# Patient Record
Sex: Female | Born: 1955
Health system: Southern US, Community
[De-identification: ages and names within clinical notes are randomized; demographics above are authoritative.]

## PROBLEM LIST (undated history)

## (undated) DIAGNOSIS — B019 Varicella without complication: Secondary | ICD-10-CM

## (undated) DIAGNOSIS — J302 Other seasonal allergic rhinitis: Secondary | ICD-10-CM

## (undated) DIAGNOSIS — B069 Rubella without complication: Secondary | ICD-10-CM

## (undated) HISTORY — DX: Rubella without complication: B06.9

## (undated) HISTORY — DX: Varicella without complication: B01.9

## (undated) HISTORY — PX: TONSILLECTOMY: SUR1361

## (undated) HISTORY — PX: AUGMENTATION MAMMAPLASTY: SUR837

## (undated) HISTORY — DX: Other seasonal allergic rhinitis: J30.2

## (undated) HISTORY — PX: MANDIBLE SURGERY: SHX707

---

## 1998-09-20 ENCOUNTER — Ambulatory Visit (HOSPITAL_COMMUNITY): Admission: RE | Admit: 1998-09-20 | Discharge: 1998-09-20 | Payer: Self-pay | Admitting: Family Medicine

## 1998-09-20 ENCOUNTER — Encounter: Payer: Self-pay | Admitting: Family Medicine

## 1999-03-02 ENCOUNTER — Other Ambulatory Visit: Admission: RE | Admit: 1999-03-02 | Discharge: 1999-03-02 | Payer: Self-pay | Admitting: Obstetrics and Gynecology

## 2002-04-24 ENCOUNTER — Ambulatory Visit (HOSPITAL_COMMUNITY): Admission: RE | Admit: 2002-04-24 | Discharge: 2002-04-24 | Payer: Self-pay | Admitting: Family Medicine

## 2002-04-24 ENCOUNTER — Encounter: Payer: Self-pay | Admitting: Family Medicine

## 2002-05-09 ENCOUNTER — Encounter: Payer: Self-pay | Admitting: Family Medicine

## 2002-05-09 ENCOUNTER — Ambulatory Visit (HOSPITAL_COMMUNITY): Admission: RE | Admit: 2002-05-09 | Discharge: 2002-05-09 | Payer: Self-pay | Admitting: Family Medicine

## 2003-04-15 ENCOUNTER — Other Ambulatory Visit: Admission: RE | Admit: 2003-04-15 | Discharge: 2003-04-15 | Payer: Self-pay | Admitting: Obstetrics and Gynecology

## 2004-01-26 ENCOUNTER — Other Ambulatory Visit: Admission: RE | Admit: 2004-01-26 | Discharge: 2004-01-26 | Payer: Self-pay | Admitting: Obstetrics and Gynecology

## 2004-11-17 ENCOUNTER — Other Ambulatory Visit: Admission: RE | Admit: 2004-11-17 | Discharge: 2004-11-17 | Payer: Self-pay | Admitting: Obstetrics and Gynecology

## 2005-07-28 ENCOUNTER — Other Ambulatory Visit: Admission: RE | Admit: 2005-07-28 | Discharge: 2005-07-28 | Payer: Self-pay | Admitting: Obstetrics and Gynecology

## 2007-04-16 ENCOUNTER — Emergency Department (HOSPITAL_COMMUNITY): Admission: EM | Admit: 2007-04-16 | Discharge: 2007-04-16 | Payer: Self-pay | Admitting: Emergency Medicine

## 2016-06-13 ENCOUNTER — Telehealth: Payer: Self-pay

## 2016-06-13 NOTE — Telephone Encounter (Signed)
Pre-visit call made to patient.

## 2016-06-13 NOTE — Telephone Encounter (Signed)
Called patient for pre visit information.

## 2016-06-14 ENCOUNTER — Encounter: Payer: Self-pay | Admitting: Family Medicine

## 2016-06-14 ENCOUNTER — Ambulatory Visit (INDEPENDENT_AMBULATORY_CARE_PROVIDER_SITE_OTHER): Payer: 59 | Admitting: Family Medicine

## 2016-06-14 VITALS — BP 133/83 | HR 96 | Temp 98.2°F | Ht 66.0 in | Wt 161.6 lb

## 2016-06-14 DIAGNOSIS — Z13220 Encounter for screening for lipoid disorders: Secondary | ICD-10-CM

## 2016-06-14 DIAGNOSIS — Z1329 Encounter for screening for other suspected endocrine disorder: Secondary | ICD-10-CM

## 2016-06-14 DIAGNOSIS — Z1321 Encounter for screening for nutritional disorder: Secondary | ICD-10-CM

## 2016-06-14 DIAGNOSIS — Z1231 Encounter for screening mammogram for malignant neoplasm of breast: Secondary | ICD-10-CM

## 2016-06-14 DIAGNOSIS — Z130 Encounter for screening for diseases of the blood and blood-forming organs and certain disorders involving the immune mechanism: Secondary | ICD-10-CM

## 2016-06-14 DIAGNOSIS — Z131 Encounter for screening for diabetes mellitus: Secondary | ICD-10-CM

## 2016-06-14 DIAGNOSIS — Z1239 Encounter for other screening for malignant neoplasm of breast: Secondary | ICD-10-CM

## 2016-06-14 DIAGNOSIS — E559 Vitamin D deficiency, unspecified: Secondary | ICD-10-CM

## 2016-06-14 DIAGNOSIS — Z1211 Encounter for screening for malignant neoplasm of colon: Secondary | ICD-10-CM

## 2016-06-14 DIAGNOSIS — Z119 Encounter for screening for infectious and parasitic diseases, unspecified: Secondary | ICD-10-CM

## 2016-06-14 LAB — HEMOGLOBIN A1C: HEMOGLOBIN A1C: 5.5 % (ref 4.6–6.5)

## 2016-06-14 LAB — LIPID PANEL
CHOLESTEROL: 221 mg/dL — AB (ref 0–200)
HDL: 56.9 mg/dL (ref >39.00)
LDL Cholesterol: 142 mg/dL — ABNORMAL HIGH (ref 0–99)
NONHDL: 163.6
Total CHOL/HDL Ratio: 4
Triglycerides: 106 mg/dL (ref 0.0–149.0)
VLDL: 21.2 mg/dL (ref 0.0–40.0)

## 2016-06-14 LAB — COMPREHENSIVE METABOLIC PANEL
ALT: 19 U/L (ref 0–35)
AST: 16 U/L (ref 0–37)
Albumin: 4.5 g/dL (ref 3.5–5.2)
Alkaline Phosphatase: 70 U/L (ref 39–117)
BUN: 17 mg/dL (ref 6–23)
CO2: 28 mEq/L (ref 19–32)
Calcium: 9.9 mg/dL (ref 8.4–10.5)
Chloride: 106 mEq/L (ref 96–112)
Creatinine, Ser: 0.74 mg/dL (ref 0.40–1.20)
GFR: 85.38 mL/min (ref >60.00)
Glucose, Bld: 93 mg/dL (ref 70–99)
Potassium: 4.3 mEq/L (ref 3.5–5.1)
Sodium: 142 mEq/L (ref 135–145)
Total Bilirubin: 1 mg/dL (ref 0.2–1.2)
Total Protein: 7.2 g/dL (ref 6.0–8.3)

## 2016-06-14 LAB — CBC
HCT: 45.7 % (ref 36.0–46.0)
Hemoglobin: 15.5 g/dL — ABNORMAL HIGH (ref 12.0–15.0)
MCHC: 33.9 g/dL (ref 30.0–36.0)
MCV: 86.1 fl (ref 78.0–100.0)
Platelets: 288 10*3/uL (ref 150.0–400.0)
RBC: 5.3 Mil/uL — ABNORMAL HIGH (ref 3.87–5.11)
RDW: 12.9 % (ref 11.5–15.5)
WBC: 8.1 10*3/uL (ref 4.0–10.5)

## 2016-06-14 LAB — VITAMIN D 25 HYDROXY (VIT D DEFICIENCY, FRACTURES): VITD: 16.36 ng/mL — ABNORMAL LOW (ref 30.00–100.00)

## 2016-06-14 LAB — TSH: TSH: 1.2 u[IU]/mL (ref 0.35–4.50)

## 2016-06-14 NOTE — Progress Notes (Addendum)
[place] [facility] [address] [place], [place] [address] [phone]  Date:  06/14/2016   Name:  Patricia Rush   DOB:  1956-02-21   MRN:  [id number]  PCP:  Lamar Blinks, MD    Chief Complaint: Establish Care (Pt here to est care. Would like to dicuss getting lab work done today. )   History of Present Illness:  Patricia Rush is a 60 y.o. very pleasant female patient who presents with the following:  Here today as a new patient- she is a [employer] employee She is going to see her daughter in [place] for the holidays  She is generally in good health There is a family history of DM She would like to have some labs today- screening for diabetes, thyroid, vitamin D, etc  She has a history of AR, but otherwise does not endorse any chronic health concerns  She has not yet had colon cancer screening but would like to have cologuard  She plans to have a mammogram soon- she is quite overdue Her pap is UTD; she did have a mildly abnl pap years ago but reports normal follow-up testing  She and her husband have 1 daughter, no grand so far  She is a Software engineer at [employer]  There are no active problems to display for this patient.   Past Medical History:  Diagnosis Date  . Chicken pox   . [place] measles   . Seasonal allergies     Past Surgical History:  Procedure Laterality Date  . MANDIBLE SURGERY    . TONSILLECTOMY      Social History  Substance Use Topics  . Smoking status: Not on file  . Smokeless tobacco: Not on file  . Alcohol use Not on file    No family history on file.  No Known Allergies  Medication list has been reviewed and updated.  No current outpatient prescriptions on file prior to visit.   No current facility-administered medications on file prior to visit.     Review of Systems:  As per HPI- otherwise negative.   Physical Examination: Vitals:   06/14/16 0935  BP: 133/83   Pulse: 96  Temp: 98.2 F (36.8 C)   Vitals:   06/14/16 0935  Weight: 161 lb 9.6 oz (73.3 kg)  Height: 5\' 6"  (1.676 m)   Body mass index is 26.08 kg/m. Ideal Body Weight: Weight in (lb) to have BMI = 25: 154.6  GEN: WDWN, NAD, Non-toxic, A & O x 3, looks well, minimal overweight HEENT: Atraumatic, Normocephalic. Neck supple. No masses, No LAD.  Bilateral TM wnl, oropharynx normal.  PEERL,EOMI.   Ears and Nose: No external deformity. CV: RRR, No M/G/R. No JVD. No thrill. No extra heart sounds. PULM: CTA B, no wheezes, crackles, rhonchi. No retractions. No resp. distress. No accessory muscle use. ABD: S, NT, ND. No rebound. No HSM. EXTR: No c/c/e NEURO Normal gait.  PSYCH: Normally interactive. Conversant. Not depressed or anxious appearing.  Calm demeanor.    Assessment and Plan: Encounter for vitamin deficiency screening - Plan: Vitamin D (25 hydroxy)  Screening for hyperlipidemia - Plan: Lipid panel  Screening for thyroid disorder - Plan: TSH  Screening for deficiency anemia - Plan: CBC  Screening for diabetes mellitus - Plan: Comprehensive metabolic panel, Hemoglobin A1C  Encounter for screening for infectious and parasitic diseases, unspecified - Plan: Hepatitis C antibody  Screening for breast cancer  Screening for  colon cancer  Here today as a new patient seeking screening labs  Also will arrange for cologuard for her and encouraged a mammogram She is slightly overweight and will continue to work on this Will plan further follow- up pending labs.   Signed Lamar Blinks, MD  Labs in 11/23  Results for orders placed or performed in visit on 06/14/16  CBC  Result Value Ref Range   WBC 8.1 4.0 - 10.5 K/uL   RBC 5.30 (H) 3.87 - 5.11 Mil/uL   Platelets 288.0 150.0 - 400.0 K/uL   Hemoglobin 15.5 (H) 12.0 - 15.0 g/dL   HCT 45.7 36.0 - 46.0 %   MCV 86.1 78.0 - 100.0 fl   MCHC 33.9 30.0 - 36.0 g/dL   RDW 12.9 11.5 - 15.5 %  Comprehensive metabolic panel   Result Value Ref Range   Sodium 142 135 - 145 mEq/L   Potassium 4.3 3.5 - 5.1 mEq/L   Chloride 106 96 - 112 mEq/L   CO2 28 19 - 32 mEq/L   Glucose, Bld 93 70 - 99 mg/dL   BUN 17 6 - 23 mg/dL   Creatinine, Ser 0.74 0.40 - 1.20 mg/dL   Total Bilirubin 1.0 0.2 - 1.2 mg/dL   Alkaline Phosphatase 70 39 - 117 U/L   AST 16 0 - 37 U/L   ALT 19 0 - 35 U/L   Total Protein 7.2 6.0 - 8.3 g/dL   Albumin 4.5 3.5 - 5.2 g/dL   Calcium 9.9 8.4 - 10.5 mg/dL   GFR 85.38 >60.00 mL/min  Lipid panel  Result Value Ref Range   Cholesterol 221 (H) 0 - 200 mg/dL   Triglycerides 106.0 0.0 - 149.0 mg/dL   HDL 56.90 >39.00 mg/dL   VLDL 21.2 0.0 - 40.0 mg/dL   LDL Cholesterol 142 (H) 0 - 99 mg/dL   Total CHOL/HDL Ratio 4    NonHDL 163.60   TSH  Result Value Ref Range   TSH 1.20 0.35 - 4.50 uIU/mL  Hemoglobin A1C  Result Value Ref Range   Hgb A1c MFr Bld 5.5 4.6 - 6.5 %  Hepatitis C antibody  Result Value Ref Range   HCV Ab NEGATIVE NEGATIVE  Vitamin D (25 hydroxy)  Result Value Ref Range   VITD 16.36 (L) 30.00 - 100.00 ng/mL   Message to pt- will rx vitamin D for her for 12 weeks.

## 2016-06-14 NOTE — Progress Notes (Signed)
Pre visit review using our clinic review tool, if applicable. No additional management support is needed unless otherwise documented below in the visit note. 

## 2016-06-14 NOTE — Patient Instructions (Signed)
It was a pleasure to meet you today- I hope that you have a wonderful visit in [place]! We will be in touch with your labs asap. The cologuard company will send you your kit to complete at home Please do set up a mammogram soon; this can be done at the ground floor imaging dept here at the [facility] if you like [phone]

## 2016-06-15 ENCOUNTER — Encounter: Payer: Self-pay | Admitting: Family Medicine

## 2016-06-15 LAB — HEPATITIS C ANTIBODY: HCV Ab: NEGATIVE

## 2016-06-15 MED ORDER — VITAMIN D (ERGOCALCIFEROL) 1.25 MG (50000 UNIT) PO CAPS: 50000.0000 [IU] | ORAL_CAPSULE | ORAL | 0 refills | Status: DC

## 2016-06-15 NOTE — Addendum Note (Signed)
Addended by: Lamar Blinks C on: 06/15/2016 05:22 PM   Modules accepted: Orders

## 2016-06-16 MED FILL — VIT D2 1.25 MG (50,000 UNIT: 1.25 MG | 84 days supply | Qty: 12 | Fill #0

## 2016-07-07 DIAGNOSIS — H52223 Regular astigmatism, bilateral: Secondary | ICD-10-CM | POA: Diagnosis not present

## 2016-07-07 DIAGNOSIS — H524 Presbyopia: Secondary | ICD-10-CM | POA: Diagnosis not present

## 2016-10-31 ENCOUNTER — Other Ambulatory Visit: Payer: Self-pay | Admitting: Family Medicine

## 2016-10-31 DIAGNOSIS — Z1231 Encounter for screening mammogram for malignant neoplasm of breast: Secondary | ICD-10-CM

## 2016-11-07 ENCOUNTER — Encounter (HOSPITAL_BASED_OUTPATIENT_CLINIC_OR_DEPARTMENT_OTHER): Payer: Self-pay | Admitting: Radiology

## 2016-11-07 ENCOUNTER — Ambulatory Visit (HOSPITAL_BASED_OUTPATIENT_CLINIC_OR_DEPARTMENT_OTHER)
Admission: RE | Admit: 2016-11-07 | Discharge: 2016-11-07 | Disposition: A | Discharge status: Home / Self Care | Payer: 59 | Source: Ambulatory Visit | Attending: Family Medicine | Admitting: Family Medicine

## 2016-11-07 DIAGNOSIS — Z1231 Encounter for screening mammogram for malignant neoplasm of breast: Secondary | ICD-10-CM | POA: Insufficient documentation

## 2016-12-03 ENCOUNTER — Encounter: Payer: Self-pay | Admitting: Emergency Medicine

## 2016-12-03 ENCOUNTER — Emergency Department
Admission: EM | Admit: 2016-12-03 | Discharge: 2016-12-03 | Disposition: A | Discharge status: Home / Self Care | Payer: 59 | Source: Home / Self Care | Attending: Family Medicine | Admitting: Family Medicine

## 2016-12-03 DIAGNOSIS — B029 Zoster without complications: Secondary | ICD-10-CM | POA: Diagnosis not present

## 2016-12-03 MED ORDER — VALACYCLOVIR HCL 1 G PO TABS: 1000.0000 mg | ORAL_TABLET | Freq: Three times a day (TID) | ORAL | 0 refills | Status: DC

## 2016-12-03 MED ORDER — PREDNISONE 20 MG PO TABS: ORAL_TABLET | ORAL | 0 refills | Status: DC

## 2016-12-03 NOTE — ED Triage Notes (Signed)
Patient has pronounced complications from bug bites. Recently had cellulitus of left arm; now has redness and edema around left eye and lymphadema in nodes left neck; leftover keflex did not clear this.

## 2016-12-03 NOTE — ED Provider Notes (Signed)
[name]    CSN: [id number] Arrival date & time: 12/03/16  1406     History   Chief Complaint Chief Complaint  Patient presents with  . Rash    HPI Patricia Rush is a 61 y.o. female.   Patient reports that she has not felt well during the past several days with fatigue, mild myalgias, and chills.  Two days ago she developed a rash around her left eye followed by mild rash on left neck and swollen left cervical nodes. She had recently been treated for cellulitis of her left arm with Keflex, and she tried some left-over Keflex without improvement of her present symptoms.   The history is provided by the patient.  Rash  Location:  Face and head/neck Head/neck rash location:  L neck Facial rash location:  L eyelid and forehead Quality: burning, dryness, itchiness, redness and swelling   Quality: not blistering, not draining, not painful, not scaling and not weeping   Severity:  Mild Onset quality:  Sudden Duration:  2 days Timing:  Constant Progression:  Worsening Chronicity:  New Context: not animal contact, not chemical exposure, not eggs, not exposure to similar rash, not food, not hot tub use, not insect bite/sting, not medications, not new detergent/soap, not nuts and not plant contact   Relieved by:  Nothing Worsened by:  Nothing Ineffective treatments: Keflex. Associated symptoms: fatigue and periorbital edema   Associated symptoms: no abdominal pain, no diarrhea, no fever, no headaches, no induration, no joint pain, no myalgias, no nausea, no shortness of breath, no sore throat and no throat swelling     Past Medical History:  Diagnosis Date  . Chicken pox   . [place] measles   . Seasonal allergies     There are no active problems to display for this patient.   Past Surgical History:  Procedure Laterality Date  . AUGMENTATION MAMMAPLASTY    . MANDIBLE SURGERY    . TONSILLECTOMY      OB History    No data available       Home  Medications    Prior to Admission medications   Medication Sig Start Date End Date Taking? Authorizing Provider  fluticasone (FLONASE) 50 MCG/ACT nasal spray Place into both nostrils daily.   Yes [provider]  fexofenadine (ALLEGRA) 30 MG tablet Take 30 mg by mouth 2 (two) times daily.    [provider]  ibuprofen (ADVIL,MOTRIN) 200 MG tablet Take 200 mg by mouth every 6 (six) hours as needed.    [provider]  predniSONE (DELTASONE) 20 MG tablet Take one tab by mouth twice daily for 4 days, then one daily. Take with food. 12/03/16   Kandra Nicolas, MD  valACYclovir (VALTREX) 1000 MG tablet Take 1 tablet (1,000 mg total) by mouth 3 (three) times daily. 12/03/16   Kandra Nicolas, MD  Vitamin D, Ergocalciferol, (DRISDOL) 50000 units CAPS capsule Take 1 capsule (50,000 Units total) by mouth every 7 (seven) days. 06/15/16   Copland, Gay Filler, MD    Family History History reviewed. No pertinent family history.  Social History Social History  Substance Use Topics  . Smoking status: Never Smoker  . Smokeless tobacco: Never Used  . Alcohol use Yes     Allergies   Patient has no known allergies.   Review of Systems Review of Systems  Constitutional: Positive for fatigue. Negative for fever.  HENT: Negative for sore throat.   Respiratory: Negative for shortness of breath.  Gastrointestinal: Negative for abdominal pain, diarrhea and nausea.  Musculoskeletal: Negative for arthralgias and myalgias.  Skin: Positive for rash.  Neurological: Negative for headaches.  All other systems reviewed and are negative.    Physical Exam Triage Vital Signs ED Triage Vitals  Enc Vitals Group     BP 12/03/16 1437 (!) 157/78     Pulse Rate 12/03/16 1437 94     Resp 12/03/16 1437 16     Temp 12/03/16 1437 98.2 F (36.8 C)     Temp Source 12/03/16 1437 Oral     SpO2 12/03/16 1437 99 %     Weight 12/03/16 1438 160 lb (72.6 kg)     Height 12/03/16 1438 5\' 6"   (1.676 m)     Head Circumference --      Peak Flow --      Pain Score 12/03/16 1438 0     Pain Loc --      Pain Edu? --      Excl. in [place]? --    No data found.   Updated Vital Signs BP (!) 157/78 (BP Location: Left Arm)   Pulse 94   Temp 98.2 F (36.8 C) (Oral)   Resp 16   Ht 5\' 6"  (1.676 m)   Wt 160 lb (72.6 kg)   SpO2 99%   BMI 25.82 kg/m   Visual Acuity Right Eye Distance:   Left Eye Distance:   Bilateral Distance:    Right Eye Near:   Left Eye Near:    Bilateral Near:     Physical Exam  Constitutional: She appears well-developed and well-nourished. No distress.  HENT:  Head: Atraumatic.    Right Ear: External ear normal.  Left Ear: External ear normal.  Nose: Nose normal.  Mouth/Throat: Oropharynx is clear and moist.  There is an erythematous herpetiform eruption on the left forehead, mild erythema and swelling of the left upper eyelid, and mild erythema of the left lateral neck.  Mild left lateral cervical adenopathy present. No rash on tip of nose.  Eyes: Conjunctivae are normal. Pupils are equal, round, and reactive to light.  Neck: Neck supple.  Cardiovascular: Normal heart sounds.   Pulmonary/Chest: Breath sounds normal.  Abdominal: There is no tenderness.  Lymphadenopathy:    She has cervical adenopathy.  Neurological: She is alert.  Skin: Skin is warm and dry.  Nursing note and vitals reviewed.    UC Treatments / Results  Labs (all labs ordered are listed, but only abnormal results are displayed) Labs Reviewed - No data to display  EKG  EKG Interpretation None       Radiology No results found.  Procedures Procedures (including critical care time)  Medications Ordered in UC Medications - No data to display   Initial Impression / Assessment and Plan / UC Course  I have reviewed the triage vital signs and the nursing notes.  Pertinent labs & imaging results that were available during my care of the patient were reviewed by me  and considered in my medical decision making (see chart for details).    Begin Valtrex, and prednisone burst/taper. Discontinue Keflex. Followup with dermatologist if left eye symptoms develop.    Final Clinical Impressions(s) / UC Diagnoses   Final diagnoses:  Herpes zoster without complication    New Prescriptions New Prescriptions   PREDNISONE (DELTASONE) 20 MG TABLET    Take one tab by mouth twice daily for 4 days, then one daily. Take with food.   VALACYCLOVIR (VALTREX) 1000  MG TABLET    Take 1 tablet (1,000 mg total) by mouth 3 (three) times daily.     Kandra Nicolas, MD 12/13/16 1053

## 2016-12-03 NOTE — Discharge Instructions (Signed)
Discontinue Keflex

## 2016-12-20 ENCOUNTER — Emergency Department
Admission: EM | Admit: 2016-12-20 | Discharge: 2016-12-20 | Disposition: A | Discharge status: Home / Self Care | Payer: 59 | Source: Home / Self Care | Attending: Family Medicine | Admitting: Family Medicine

## 2016-12-20 ENCOUNTER — Emergency Department (INDEPENDENT_AMBULATORY_CARE_PROVIDER_SITE_OTHER): Payer: 59

## 2016-12-20 ENCOUNTER — Encounter: Payer: Self-pay | Admitting: Emergency Medicine

## 2016-12-20 DIAGNOSIS — J209 Acute bronchitis, unspecified: Secondary | ICD-10-CM | POA: Diagnosis not present

## 2016-12-20 DIAGNOSIS — R509 Fever, unspecified: Secondary | ICD-10-CM | POA: Diagnosis not present

## 2016-12-20 DIAGNOSIS — R05 Cough: Secondary | ICD-10-CM | POA: Diagnosis not present

## 2016-12-20 MED ORDER — BENZONATATE 200 MG PO CAPS: ORAL_CAPSULE | ORAL | 0 refills | Status: DC

## 2016-12-20 NOTE — Discharge Instructions (Signed)
Begin Azithromycin 250mg : two tabs today, then one tab daily for 4 more days. Take plain guaifenesin (1200mg  extended release tabs such as Mucinex) twice daily, with plenty of water, for cough and congestion.  Get adequate rest.   Try warm salt water gargles for sore throat.  Stop all antihistamines for now, and other non-prescription cough/cold preparations.

## 2016-12-20 NOTE — ED Triage Notes (Signed)
Dry cough x 5 days fever 52f 102.2 today, body aches

## 2016-12-20 NOTE — ED Provider Notes (Signed)
[name]    CSN: [id number] Arrival date & time: 12/20/16  1700     History   Chief Complaint Chief Complaint  Patient presents with  . Cough    HPI Patricia Rush is a 61 y.o. female.   Patient developed a non-productive cough five days ago without sore throat or sinus congestion.  She has had chills, and today had fever to 102.  She has seasonal allergies, and past history of left lower lobe pneumonia 2003. She has a family history of asthma (brother).   The history is provided by the patient.    Past Medical History:  Diagnosis Date  . Chicken pox   . [place] measles   . Seasonal allergies     There are no active problems to display for this patient.   Past Surgical History:  Procedure Laterality Date  . AUGMENTATION MAMMAPLASTY    . MANDIBLE SURGERY    . TONSILLECTOMY      OB History    No data available       Home Medications    Prior to Admission medications   Medication Sig Start Date End Date Taking? Authorizing Provider  benzonatate (TESSALON) 200 MG capsule Take one cap by mouth at bedtime as needed for cough.  May repeat in 4 to 6 hours 12/20/16   Kandra Nicolas, MD  fexofenadine (ALLEGRA) 30 MG tablet Take 30 mg by mouth 2 (two) times daily.    [provider]  fluticasone (FLONASE) 50 MCG/ACT nasal spray Place into both nostrils daily.    [provider]  ibuprofen (ADVIL,MOTRIN) 200 MG tablet Take 200 mg by mouth every 6 (six) hours as needed.    [provider]  valACYclovir (VALTREX) 1000 MG tablet Take 1 tablet (1,000 mg total) by mouth 3 (three) times daily. 12/03/16   Kandra Nicolas, MD  Vitamin D, Ergocalciferol, (DRISDOL) 50000 units CAPS capsule Take 1 capsule (50,000 Units total) by mouth every 7 (seven) days. 06/15/16   Copland, Gay Filler, MD    Family History No family history on file.  Social History Social History  Substance Use Topics  . Smoking status: Never Smoker  . Smokeless  tobacco: Never Used  . Alcohol use Yes     Allergies   Patient has no known allergies.   Review of Systems Review of Systems  No sore throat + cough No pleuritic pain No wheezing No nasal congestion ? post-nasal drainage No sinus pain/pressure No itchy/red eyes No earache No hemoptysis No SOB + fever, + chills No nausea No vomiting No abdominal pain No diarrhea No urinary symptoms No skin rash + fatigue No myalgias No headache Used OTC meds without relief    Physical Exam Triage Vital Signs ED Triage Vitals  Enc Vitals Group     BP 12/20/16 1719 (!) 152/86     Pulse Rate 12/20/16 1719 (!) 102     Resp --      Temp 12/20/16 1719 99.4 F (37.4 C)     Temp Source 12/20/16 1719 Oral     SpO2 12/20/16 1719 96 %     Weight 12/20/16 1720 160 lb (72.6 kg)     Height 12/20/16 1720 5\' 6"  (1.676 m)     Head Circumference --      Peak Flow --      Pain Score 12/20/16 1720 2     Pain Loc --      Pain Edu? --  Excl. in [place]? --    No data found.   Updated Vital Signs BP (!) 152/86 (BP Location: Left Arm)   Pulse (!) 102   Temp 99.4 F (37.4 C) (Oral)   Ht 5\' 6"  (1.676 m)   Wt 160 lb (72.6 kg)   SpO2 96%   BMI 25.82 kg/m   Visual Acuity Right Eye Distance:   Left Eye Distance:   Bilateral Distance:    Right Eye Near:   Left Eye Near:    Bilateral Near:     Physical Exam Nursing notes and Vital Signs reviewed. Appearance:  Patient appears stated age, and in no acute distress Eyes:  Pupils are equal, round, and reactive to light and accomodation.  Extraocular movement is intact.  Conjunctivae are not inflamed  Ears:  Canals normal.  Tympanic membranes normal.  Nose:  Mildly congested turbinates.  No sinus tenderness.    Pharynx:  Normal Neck:  Supple.  Nontender enlarged posterior/lateral nodes are palpated bilaterally  Lungs:  Clear to auscultation.  Breath sounds are equal.  Moving air well. Heart:  Regular rate and rhythm without murmurs,  rubs, or gallops.  Abdomen:  Nontender without masses or hepatosplenomegaly.  Bowel sounds are present.  No CVA or flank tenderness.  Extremities:  No edema.  Skin:  No rash present.    UC Treatments / Results  Labs (all labs ordered are listed, but only abnormal results are displayed) Labs Reviewed - No data to display  EKG  EKG Interpretation None       Radiology Dg Chest 2 View  Result Date: 12/20/2016 CLINICAL DATA:  Nonproductive cough for 5 days.  Fever EXAM: CHEST  2 VIEW COMPARISON:  None FINDINGS: The heart size and mediastinal contours are within normal limits. Both lungs are clear. The visualized skeletal structures are unremarkable. IMPRESSION: No active cardiopulmonary disease. Electronically Signed   By: Kerby Moors M.D.   On: 12/20/2016 18:07    Procedures Procedures (including critical care time)  Medications Ordered in UC Medications - No data to display   Initial Impression / Assessment and Plan / UC Course  I have reviewed the triage vital signs and the nursing notes.  Pertinent labs & imaging results that were available during my care of the patient were reviewed by me and considered in my medical decision making (see chart for details).    Begin Z-pak for atypical coverage (already has Rx at home) Prescription written for Benzonatate [facility]) to take at bedtime for night-time cough.    Take plain guaifenesin (1200mg  extended release tabs such as Mucinex) twice daily, with plenty of water, for cough and congestion.  Get adequate rest.   Try warm salt water gargles for sore throat.  Stop all antihistamines for now, and other non-prescription cough/cold preparations. Followup with Family Doctor if not improved in one week.     Final Clinical Impressions(s) / UC Diagnoses   Final diagnoses:  Acute bronchitis, unspecified organism    New Prescriptions New Prescriptions   BENZONATATE (TESSALON) 200 MG CAPSULE    Take one cap by mouth at bedtime  as needed for cough.  May repeat in 4 to 6 hours     Kandra Nicolas, MD 12/24/16 [phone]

## 2016-12-21 MED FILL — BENZONATATE 200 MG CAP: 200 | 3 days supply | Qty: 15 | Fill #0

## 2017-09-06 DIAGNOSIS — L814 Other melanin hyperpigmentation: Secondary | ICD-10-CM | POA: Diagnosis not present

## 2017-09-06 DIAGNOSIS — L821 Other seborrheic keratosis: Secondary | ICD-10-CM | POA: Diagnosis not present

## 2017-09-06 DIAGNOSIS — D2272 Melanocytic nevi of left lower limb, including hip: Secondary | ICD-10-CM | POA: Diagnosis not present

## 2017-09-06 DIAGNOSIS — D2371 Other benign neoplasm of skin of right lower limb, including hip: Secondary | ICD-10-CM | POA: Diagnosis not present

## 2017-12-05 ENCOUNTER — Encounter: Payer: Self-pay | Admitting: Family Medicine

## 2017-12-05 DIAGNOSIS — Z1212 Encounter for screening for malignant neoplasm of rectum: Secondary | ICD-10-CM | POA: Diagnosis not present

## 2017-12-05 DIAGNOSIS — Z1211 Encounter for screening for malignant neoplasm of colon: Secondary | ICD-10-CM | POA: Diagnosis not present

## 2017-12-12 ENCOUNTER — Telehealth: Payer: Self-pay | Admitting: Family Medicine

## 2017-12-12 NOTE — Telephone Encounter (Signed)
Copied from [place] [phone]. Topic: Quick Communication - See Telephone Encounter >> Dec 12, 2017  1:17 PM Ether Griffins B wrote: CRM for notification. See Telephone encounter for: 12/12/17.  Pt turned in colo guard on 12/08/17 but the order was placed in 2017 so they are needing a new order placed so they can look at her specimen. Fax number [phone]

## 2017-12-13 NOTE — Telephone Encounter (Signed)
New order has bene faxed.

## 2017-12-21 LAB — COLOGUARD

## 2017-12-27 ENCOUNTER — Encounter: Payer: 59 | Admitting: Family Medicine

## 2017-12-28 ENCOUNTER — Telehealth: Payer: Self-pay | Admitting: *Deleted

## 2017-12-28 NOTE — Telephone Encounter (Signed)
Received Cologuard Results; forwarded to provider/SLS 06/07

## 2017-12-31 ENCOUNTER — Encounter: Payer: Self-pay | Admitting: Family Medicine

## 2018-01-12 NOTE — Progress Notes (Addendum)
[facility] [place], [place], [place] [address] [phone]  Date:  01/14/2018   Name:  Patricia Rush   DOB:  08-04-1955   MRN:  [id number]  PCP:  [name], MD    Chief Complaint: Annual Exam (a1c, hypertension, lipid panel, shingles vaccine, pap smear) and Weight Gain   History of Present Illness:  ALAISA Rush is a 62 y.o. very pleasant female patient who presents with the following:  I last saw her in November of 2017: Here today as a new patient- she is a Furniture conservator/restorer She is going to see her daughter in [place] for the holidays She is generally in good health There is a family history of DM She would like to have some labs today- screening for diabetes, thyroid, vitamin D, etc She has a history of AR, but otherwise does not endorse any chronic health concern Her pap is UTD; she did have a mildly abnl pap years ago but reports normal follow-up testing She and her husband have 1 daughter, no grand so far She is a Software engineer at [employer]  Would like to have screening labs today She is still working at [employer]  She would like to do a pap today - we will do for her  She has noted some weight gain- about 10 lbs- over the last month or so She is not sure why this might be but notes that she is not exercising like she should No LE edeam  LMP 8-9 years ago Occasional hot flashes No bleeding since menopause   She wants to do more exercise She likes to do cardio.    She is traveling to [place] this summer to do a service project with her church, will be leading some youth Wt Readings from Last 3 Encounters:  01/14/18 170 lb (77.1 kg)  12/20/16 160 lb (72.6 kg)  12/03/16 160 lb (72.6 kg)   She has been sick for about 8 days with ST, cough, LAD in her neck She did have a fever- less than 101 She is feeling better in this regard and seems to be on the mend No SOB No orthopnea  Last mammo was  4/18 Last pap: she is not sure, will update today Last colon; cologuard 5/19 TDAP is UTD through the hospital We don't have shingrix in stock today  Her mom did have osteoporosis   BP Readings from Last 3 Encounters:  01/14/18 140/88  12/20/16 (!) 152/86  12/03/16 (!) 157/78    There are no active problems to display for this patient.   Past Medical History:  Diagnosis Date  . Chicken pox   . [place] measles   . Seasonal allergies     Past Surgical History:  Procedure Laterality Date  . AUGMENTATION MAMMAPLASTY    . MANDIBLE SURGERY    . TONSILLECTOMY      Social History   Tobacco Use  . Smoking status: Never Smoker  . Smokeless tobacco: Never Used  Substance Use Topics  . Alcohol use: Yes  . Drug use: Not on file    History reviewed. No pertinent family history.  No Known Allergies  Medication list has been reviewed and updated.  Current Outpatient Medications on File Prior to Visit  Medication Sig Dispense Refill  . fexofenadine (ALLEGRA) 180 MG tablet Take 180 mg by mouth daily.    . fluticasone (FLONASE) 50 MCG/ACT nasal spray Place  into both nostrils daily.    Marland Kitchen ibuprofen (ADVIL,MOTRIN) 600 MG tablet Take 600 mg by mouth every 6 (six) hours as needed.    . valACYclovir (VALTREX) 1000 MG tablet Take 1 tablet (1,000 mg total) by mouth 3 (three) times daily. 21 tablet 0  . Vitamin D, Ergocalciferol, 2000 units CAPS Take 2,000 Units by mouth daily.     No current facility-administered medications on file prior to visit.     Review of Systems:  As per HPI- otherwise negative.   Physical Examination: Vitals:   01/14/18 1256  BP: 140/88  Pulse: 68  Resp: 16  Temp: 98.3 F (36.8 C)  SpO2: 98%   Vitals:   01/14/18 1256  Weight: 170 lb (77.1 kg)  Height: 5\' 6"  (1.676 m)   Body mass index is 27.44 kg/m. Ideal Body Weight: Weight in (lb) to have BMI = 25: 154.6  GEN: WDWN, NAD, Non-toxic, A & O x 3, looks well, overweight  HEENT: Atraumatic,  Normocephalic. Neck supple. No masses, No LAD.  Bilateral TM wnl, oropharynx normal.  PEERL,EOMI.   Ears and Nose: No external deformity. CV: RRR, No M/G/R. No JVD. No thrill. No extra heart sounds. PULM: CTA B, no wheezes, crackles, rhonchi. No retractions. No resp. distress. No accessory muscle use. ABD: S, NT, ND, +BS. No rebound. No HSM. EXTR: No c/c/e NEURO Normal gait.  PSYCH: Normally interactive. Conversant. Not depressed or anxious appearing.  Calm demeanor.  Breast: normal exam, no masses/ dimpling/ discharge- implants bilaterally  Pelvic: normal, no vaginal lesions or discharge. Uterus normal, no CMT, no adnexal tendereness or masses She does have a small red polyp at the cervical os  Assessment and Plan: Physical exam  Cervical polyp - Plan: Ambulatory referral to Obstetrics / Gynecology  Screening for cervical cancer - Plan: Cytology - PAP  Weight gain - Plan: TSH  Screening for deficiency anemia - Plan: CBC  Screening for hyperlipidemia - Plan: Lipid panel  Screening for diabetes mellitus - Plan: Comprehensive metabolic panel, Hemoglobin A1c  CPE today Labs pending as above Will plan further follow- up pending labs. Referral to OBG -? Does polyp need to be removed  Encouraged exercise and diet to return to her baseline weight of 160 lbs   Signed Lamar Blinks, MD  Received her labs  Results for orders placed or performed in visit on 01/14/18  CBC  Result Value Ref Range   WBC 8.4 4.0 - 10.5 K/uL   RBC 5.00 3.87 - 5.11 Mil/uL   Platelets 310.0 150.0 - 400.0 K/uL   Hemoglobin 14.5 12.0 - 15.0 g/dL   HCT 42.4 36.0 - 46.0 %   MCV 84.8 78.0 - 100.0 fl   MCHC 34.2 30.0 - 36.0 g/dL   RDW 12.7 11.5 - 15.5 %  Comprehensive metabolic panel  Result Value Ref Range   Sodium 140 135 - 145 mEq/L   Potassium 4.2 3.5 - 5.1 mEq/L   Chloride 105 96 - 112 mEq/L   CO2 26 19 - 32 mEq/L   Glucose, Bld 87 70 - 99 mg/dL   BUN 16 6 - 23 mg/dL   Creatinine, Ser 0.73 0.40  - 1.20 mg/dL   Total Bilirubin 1.0 0.2 - 1.2 mg/dL   Alkaline Phosphatase 72 39 - 117 U/L   AST 17 0 - 37 U/L   ALT 19 0 - 35 U/L   Total Protein 6.6 6.0 - 8.3 g/dL   Albumin 4.2 3.5 - 5.2 g/dL  Calcium 9.8 8.4 - 10.5 mg/dL   GFR 86.27 >60.00 mL/min  Hemoglobin A1c  Result Value Ref Range   Hgb A1c MFr Bld 5.6 4.6 - 6.5 %  Lipid panel  Result Value Ref Range   Cholesterol 231 (H) 0 - 200 mg/dL   Triglycerides 91.0 0.0 - 149.0 mg/dL   HDL 50.70 >39.00 mg/dL   VLDL 18.2 0.0 - 40.0 mg/dL   LDL Cholesterol 162 (H) 0 - 99 mg/dL   Total CHOL/HDL Ratio 5    NonHDL 180.68   TSH  Result Value Ref Range   TSH 2.68 0.35 - 4.50 uIU/mL   Message to pt    Blood counts are normal Metabolic profile, A5W, thyroid all normal Cholesterol not as favorable as I would like- please work on diet/ exercise/ weight loss and let's repeat in about 6 months.  If not improved we can think about a cholesterol med then Take care! JC   Received her pap 6/27- message to pt.  Will set up for OBG eval as pt needs colposcopy   Your pap was abnormal I'm afraid- did not show cancer but did show suspicious cells that need further evaluation.  I am going to set you up to see OB/GYN asap.  They will do an in-office test called colposcopy to look more closely at your cervix and will biopsy any suspicious areas for further testing. Please let me know if you have any questions and I will get your referral going right away.  Please contact me if you don't hear about your referral in the next week or so Adequacy Satisfactory for evaluation endocervical/transformation zone component PRESENT.Abnormal    Diagnosis ATYPICAL SQUAMOUS CELLS CANNOT EXCLUDE A HIGH GRADE SQUAMOUS INTRAEPITHELIAL LESION (ASC-H).Abnormal    HPV NOT DETECTED   Comment: Normal Reference Range - NOT Detected  Material Submitted CervicoVaginal Pap [ThinPrep Imaged]Abnormal

## 2018-01-14 ENCOUNTER — Encounter: Payer: Self-pay | Admitting: Family Medicine

## 2018-01-14 ENCOUNTER — Ambulatory Visit (INDEPENDENT_AMBULATORY_CARE_PROVIDER_SITE_OTHER): Payer: 59 | Admitting: Family Medicine

## 2018-01-14 ENCOUNTER — Other Ambulatory Visit (HOSPITAL_COMMUNITY)
Admission: RE | Admit: 2018-01-14 | Discharge: 2018-01-14 | Disposition: A | Discharge status: Home / Self Care | Payer: 59 | Source: Ambulatory Visit | Attending: Family Medicine | Admitting: Family Medicine

## 2018-01-14 VITALS — BP 140/88 | HR 68 | Temp 98.3°F | Resp 16 | Ht 66.0 in | Wt 170.0 lb

## 2018-01-14 DIAGNOSIS — Z124 Encounter for screening for malignant neoplasm of cervix: Secondary | ICD-10-CM | POA: Diagnosis not present

## 2018-01-14 DIAGNOSIS — Z13220 Encounter for screening for lipoid disorders: Secondary | ICD-10-CM

## 2018-01-14 DIAGNOSIS — N841 Polyp of cervix uteri: Secondary | ICD-10-CM

## 2018-01-14 DIAGNOSIS — Z130 Encounter for screening for diseases of the blood and blood-forming organs and certain disorders involving the immune mechanism: Secondary | ICD-10-CM

## 2018-01-14 DIAGNOSIS — Z0000 Encounter for general adult medical examination without abnormal findings: Secondary | ICD-10-CM | POA: Diagnosis not present

## 2018-01-14 DIAGNOSIS — R87621 Atypical squamous cells cannot exclude high grade squamous intraepithelial lesion on cytologic smear of vagina (ASC-H): Secondary | ICD-10-CM

## 2018-01-14 DIAGNOSIS — R635 Abnormal weight gain: Secondary | ICD-10-CM

## 2018-01-14 DIAGNOSIS — Z01419 Encounter for gynecological examination (general) (routine) without abnormal findings: Secondary | ICD-10-CM | POA: Diagnosis not present

## 2018-01-14 DIAGNOSIS — Z131 Encounter for screening for diabetes mellitus: Secondary | ICD-10-CM

## 2018-01-14 LAB — COMPREHENSIVE METABOLIC PANEL
ALT: 19 U/L (ref 0–35)
AST: 17 U/L (ref 0–37)
Albumin: 4.2 g/dL (ref 3.5–5.2)
Alkaline Phosphatase: 72 U/L (ref 39–117)
BUN: 16 mg/dL (ref 6–23)
CALCIUM: 9.8 mg/dL (ref 8.4–10.5)
CHLORIDE: 105 meq/L (ref 96–112)
CO2: 26 meq/L (ref 19–32)
Creatinine, Ser: 0.73 mg/dL (ref 0.40–1.20)
GFR: 86.27 mL/min (ref >60.00)
GLUCOSE: 87 mg/dL (ref 70–99)
Potassium: 4.2 mEq/L (ref 3.5–5.1)
Sodium: 140 mEq/L (ref 135–145)
Total Bilirubin: 1 mg/dL (ref 0.2–1.2)
Total Protein: 6.6 g/dL (ref 6.0–8.3)

## 2018-01-14 LAB — CBC
HCT: 42.4 % (ref 36.0–46.0)
HEMOGLOBIN: 14.5 g/dL (ref 12.0–15.0)
MCHC: 34.2 g/dL (ref 30.0–36.0)
MCV: 84.8 fl (ref 78.0–100.0)
PLATELETS: 310 10*3/uL (ref 150.0–400.0)
RBC: 5 Mil/uL (ref 3.87–5.11)
RDW: 12.7 % (ref 11.5–15.5)
WBC: 8.4 10*3/uL (ref 4.0–10.5)

## 2018-01-14 LAB — LIPID PANEL
CHOL/HDL RATIO: 5
CHOLESTEROL: 231 mg/dL — AB (ref 0–200)
HDL: 50.7 mg/dL (ref >39.00)
LDL CALC: 162 mg/dL — AB (ref 0–99)
NonHDL: 180.68
TRIGLYCERIDES: 91 mg/dL (ref 0.0–149.0)
VLDL: 18.2 mg/dL (ref 0.0–40.0)

## 2018-01-14 LAB — TSH: TSH: 2.68 u[IU]/mL (ref 0.35–4.50)

## 2018-01-14 LAB — HEMOGLOBIN A1C: Hgb A1c MFr Bld: 5.6 % (ref 4.6–6.5)

## 2018-01-14 NOTE — Patient Instructions (Addendum)
Good to see you today! I will be in touch with your labs asap Will refer to GYN here at the [facility] for your cervical polyp.  This may need to be removed  Pneumonia vaccine due at age 62  Please monitor your blood pressure at work and let me know if you are running higher than 140/85 on a regular basis    Health Maintenance for Postmenopausal Women Menopause is a normal process in which your reproductive ability comes to an end. This process happens gradually over a span of months to years, usually between the ages of 15 and 12. Menopause is complete when you have missed 12 consecutive menstrual periods. It is important to talk with your health care provider about some of the most common conditions that affect postmenopausal women, such as heart disease, cancer, and bone loss (osteoporosis). Adopting a healthy lifestyle and getting preventive care can help to promote your health and wellness. Those actions can also lower your chances of developing some of these common conditions. What should I know about menopause? During menopause, you may experience a number of symptoms, such as:  Moderate-to-severe hot flashes.  Night sweats.  Decrease in sex drive.  Mood swings.  Headaches.  Tiredness.  Irritability.  Memory problems.  Insomnia.  Choosing to treat or not to treat menopausal changes is an individual decision that you make with your health care provider. What should I know about hormone replacement therapy and supplements? Hormone therapy products are effective for treating symptoms that are associated with menopause, such as hot flashes and night sweats. Hormone replacement carries certain risks, especially as you become older. If you are thinking about using estrogen or estrogen with progestin treatments, discuss the benefits and risks with your health care provider. What should I know about heart disease and stroke? Heart disease, heart attack, and stroke become more likely  as you age. This may be due, in part, to the hormonal changes that your body experiences during menopause. These can affect how your body processes dietary fats, triglycerides, and cholesterol. Heart attack and stroke are both medical emergencies. There are many things that you can do to help prevent heart disease and stroke:  Have your blood pressure checked at least every 1-2 years. High blood pressure causes heart disease and increases the risk of stroke.  If you are 24-41 years old, ask your health care provider if you should take aspirin to prevent a heart attack or a stroke.  Do not use any tobacco products, including cigarettes, chewing tobacco, or electronic cigarettes. If you need help quitting, ask your health care provider.  It is important to eat a healthy diet and maintain a healthy weight. ? Be sure to include plenty of vegetables, fruits, low-fat dairy products, and lean protein. ? Avoid eating foods that are high in solid fats, added sugars, or salt (sodium).  Get regular exercise. This is one of the most important things that you can do for your health. ? Try to exercise for at least 150 minutes each week. The type of exercise that you do should increase your heart rate and make you sweat. This is known as moderate-intensity exercise. ? Try to do strengthening exercises at least twice each week. Do these in addition to the moderate-intensity exercise.  Know your numbers.Ask your health care provider to check your cholesterol and your blood glucose. Continue to have your blood tested as directed by your health care provider.  What should I know about cancer screening? There  are several types of cancer. Take the following steps to reduce your risk and to catch any cancer development as early as possible. Breast Cancer  Practice breast self-awareness. ? This means understanding how your breasts normally appear and feel. ? It also means doing regular breast self-exams. Let your  health care provider know about any changes, no matter how small.  If you are 66 or older, have a clinician do a breast exam (clinical breast exam or CBE) every year. Depending on your age, family history, and medical history, it may be recommended that you also have a yearly breast X-ray (mammogram).  If you have a family history of breast cancer, talk with your health care provider about genetic screening.  If you are at high risk for breast cancer, talk with your health care provider about having an MRI and a mammogram every year.  Breast cancer (BRCA) gene test is recommended for women who have family members with BRCA-related cancers. Results of the assessment will determine the need for genetic counseling and BRCA1 and for BRCA2 testing. BRCA-related cancers include these types: ? Breast. This occurs in males or females. ? Ovarian. ? Tubal. This may also be called fallopian tube cancer. ? Cancer of the abdominal or pelvic lining (peritoneal cancer). ? Prostate. ? Pancreatic.  Cervical, Uterine, and Ovarian Cancer Your health care provider may recommend that you be screened regularly for cancer of the pelvic organs. These include your ovaries, uterus, and vagina. This screening involves a pelvic exam, which includes checking for microscopic changes to the surface of your cervix (Pap test).  For women ages 21-65, health care providers may recommend a pelvic exam and a Pap test every three years. For women ages 25-65, they may recommend the Pap test and pelvic exam, combined with testing for human papilloma virus (HPV), every five years. Some types of HPV increase your risk of cervical cancer. Testing for HPV may also be done on women of any age who have unclear Pap test results.  Other health care providers may not recommend any screening for nonpregnant women who are considered low risk for pelvic cancer and have no symptoms. Ask your health care provider if a screening pelvic exam is right  for you.  If you have had past treatment for cervical cancer or a condition that could lead to cancer, you need Pap tests and screening for cancer for at least 20 years after your treatment. If Pap tests have been discontinued for you, your risk factors (such as having a new sexual partner) need to be reassessed to determine if you should start having screenings again. Some women have medical problems that increase the chance of getting cervical cancer. In these cases, your health care provider may recommend that you have screening and Pap tests more often.  If you have a family history of uterine cancer or ovarian cancer, talk with your health care provider about genetic screening.  If you have vaginal bleeding after reaching menopause, tell your health care provider.  There are currently no reliable tests available to screen for ovarian cancer.  Lung Cancer Lung cancer screening is recommended for adults 68-29 years old who are at high risk for lung cancer because of a history of smoking. A yearly low-dose CT scan of the lungs is recommended if you:  Currently smoke.  Have a history of at least 30 pack-years of smoking and you currently smoke or have quit within the past 15 years. A pack-year is smoking an average  of one pack of cigarettes per day for one year.  Yearly screening should:  Continue until it has been 15 years since you quit.  Stop if you develop a health problem that would prevent you from having lung cancer treatment.  Colorectal Cancer  This type of cancer can be detected and can often be prevented.  Routine colorectal cancer screening usually begins at age 53 and continues through age 19.  If you have risk factors for colon cancer, your health care provider may recommend that you be screened at an earlier age.  If you have a family history of colorectal cancer, talk with your health care provider about genetic screening.  Your health care provider may also  recommend using home test kits to check for hidden blood in your stool.  A small camera at the end of a tube can be used to examine your colon directly (sigmoidoscopy or colonoscopy). This is done to check for the earliest forms of colorectal cancer.  Direct examination of the colon should be repeated every 5-10 years until age 69. However, if early forms of precancerous polyps or small growths are found or if you have a family history or genetic risk for colorectal cancer, you may need to be screened more often.  Skin Cancer  Check your skin from head to toe regularly.  Monitor any moles. Be sure to tell your health care provider: ? About any new moles or changes in moles, especially if there is a change in a mole's shape or color. ? If you have a mole that is larger than the size of a pencil eraser.  If any of your family members has a history of skin cancer, especially at a young age, talk with your health care provider about genetic screening.  Always use sunscreen. Apply sunscreen liberally and repeatedly throughout the day.  Whenever you are outside, protect yourself by wearing long sleeves, pants, a wide-brimmed hat, and sunglasses.  What should I know about osteoporosis? Osteoporosis is a condition in which bone destruction happens more quickly than new bone creation. After menopause, you may be at an increased risk for osteoporosis. To help prevent osteoporosis or the bone fractures that can happen because of osteoporosis, the following is recommended:  If you are 32-10 years old, get at least 1,000 mg of calcium and at least 600 mg of vitamin D per day.  If you are older than age 32 but younger than age 26, get at least 1,200 mg of calcium and at least 600 mg of vitamin D per day.  If you are older than age 18, get at least 1,200 mg of calcium and at least 800 mg of vitamin D per day.  Smoking and excessive alcohol intake increase the risk of osteoporosis. Eat foods that are  rich in calcium and vitamin D, and do weight-bearing exercises several times each week as directed by your health care provider. What should I know about how menopause affects my mental health? Depression may occur at any age, but it is more common as you become older. Common symptoms of depression include:  Low or sad mood.  Changes in sleep patterns.  Changes in appetite or eating patterns.  Feeling an overall lack of motivation or enjoyment of activities that you previously enjoyed.  Frequent crying spells.  Talk with your health care provider if you think that you are experiencing depression. What should I know about immunizations? It is important that you get and maintain your immunizations. These  include:  Tetanus, diphtheria, and pertussis (Tdap) booster vaccine.  Influenza every year before the flu season begins.  Pneumonia vaccine.  Shingles vaccine.  Your health care provider may also recommend other immunizations. This information is not intended to replace advice given to you by your health care provider. Make sure you discuss any questions you have with your health care provider. Document Released: 09/01/2005 Document Revised: 01/28/2016 Document Reviewed: 04/13/2015 Elsevier Interactive Patient Education  2018 [employer].

## 2018-01-16 LAB — CYTOLOGY - PAP: HPV: NOT DETECTED

## 2018-01-17 ENCOUNTER — Encounter: Payer: Self-pay | Admitting: Family Medicine

## 2018-01-17 NOTE — Addendum Note (Signed)
Addended by: Lamar Blinks C on: 01/17/2018 06:27 AM   Modules accepted: Orders

## 2018-02-20 ENCOUNTER — Ambulatory Visit (INDEPENDENT_AMBULATORY_CARE_PROVIDER_SITE_OTHER): Payer: 59 | Admitting: Obstetrics & Gynecology

## 2018-02-20 ENCOUNTER — Encounter: Payer: Self-pay | Admitting: Obstetrics & Gynecology

## 2018-02-20 ENCOUNTER — Other Ambulatory Visit (HOSPITAL_COMMUNITY)
Admission: RE | Admit: 2018-02-20 | Discharge: 2018-02-20 | Disposition: A | Discharge status: Home / Self Care | Payer: 59 | Source: Ambulatory Visit | Attending: Obstetrics & Gynecology | Admitting: Obstetrics & Gynecology

## 2018-02-20 VITALS — BP 145/74 | HR 75 | Ht 66.0 in | Wt 168.0 lb

## 2018-02-20 DIAGNOSIS — N841 Polyp of cervix uteri: Secondary | ICD-10-CM

## 2018-02-20 DIAGNOSIS — R87611 Atypical squamous cells cannot exclude high grade squamous intraepithelial lesion on cytologic smear of cervix (ASC-H): Secondary | ICD-10-CM | POA: Diagnosis not present

## 2018-02-20 NOTE — Progress Notes (Signed)
surg path Patient given informed consent, signed copy in the chart, time out was performed.  Placed in lithotomy position. Cervix viewed with speculum and colposcope after application of acetic acid.  01/14/2018 Satisfactory for evaluation endocervical/transformation zone component PRESENT.Abnormal     Diagnosis ATYPICAL SQUAMOUS CELLS CANNOT EXCLUDE A HIGH GRADE SQUAMOUS INTRAEPITHELIAL LESION (ASC-H).Abnormal    HPV NOT DETECTED   Comment: Normal Reference Range - NOT Detected  Material Submitted CervicoVaginal Pap [ThinPrep Imaged]Abnormal      Colposcopy adequate?  yes Acetowhite lesions? no Punctation? no Mosaicism?  No Abnormal vasculature?  No Biopsies? no ECC? Yes Endocervical polyp removed     Patient was given post procedure instructions.  F/u surg path. If WNL needs repeat PAP in 1 year.   Layana Konkel L. Harraway-Smith, M.D., Cherlynn June

## 2018-02-20 NOTE — Patient Instructions (Signed)

## 2019-03-09 NOTE — Patient Instructions (Addendum)
It was very nice to see you again today, I will be in touch with your labs and Pap as soon as possible 2nd shingles vaccine due in 2-6 months   Please continue to monitor your BP at home- if your average is higher than 135- 140/90 we should consider medication for you    We will do a Ca-125 lab for you today to look for any evidence of ovarian cancer   Health Maintenance, Female Adopting a healthy lifestyle and getting preventive care are important in promoting health and wellness. Ask your health care provider about:  The right schedule for you to have regular tests and exams.  Things you can do on your own to prevent diseases and keep yourself healthy. What should I know about diet, weight, and exercise? Eat a healthy diet   Eat a diet that includes plenty of vegetables, fruits, low-fat dairy products, and lean protein.  Do not eat a lot of foods that are high in solid fats, added sugars, or sodium. Maintain a healthy weight Body mass index (BMI) is used to identify weight problems. It estimates body fat based on height and weight. Your health care provider can help determine your BMI and help you achieve or maintain a healthy weight. Get regular exercise Get regular exercise. This is one of the most important things you can do for your health. Most adults should:  Exercise for at least 150 minutes each week. The exercise should increase your heart rate and make you sweat (moderate-intensity exercise).  Do strengthening exercises at least twice a week. This is in addition to the moderate-intensity exercise.  Spend less time sitting. Even light physical activity can be beneficial. Watch cholesterol and blood lipids Have your blood tested for lipids and cholesterol at 62 years of age, then have this test every 5 years. Have your cholesterol levels checked more often if:  Your lipid or cholesterol levels are high.  You are older than 62 years of age.  You are at high risk for  heart disease. What should I know about cancer screening? Depending on your health history and family history, you may need to have cancer screening at various ages. This may include screening for:  Breast cancer.  Cervical cancer.  Colorectal cancer.  Skin cancer.  Lung cancer. What should I know about heart disease, diabetes, and high blood pressure? Blood pressure and heart disease  High blood pressure causes heart disease and increases the risk of stroke. This is more likely to develop in people who have high blood pressure readings, are of African descent, or are overweight.  Have your blood pressure checked: ? Every 3-5 years if you are 45-44 years of age. ? Every year if you are 31 years old or older. Diabetes Have regular diabetes screenings. This checks your fasting blood sugar level. Have the screening done:  Once every three years after age 15 if you are at a normal weight and have a low risk for diabetes.  More often and at a younger age if you are overweight or have a high risk for diabetes. What should I know about preventing infection? Hepatitis B If you have a higher risk for hepatitis B, you should be screened for this virus. Talk with your health care provider to find out if you are at risk for hepatitis B infection. Hepatitis C Testing is recommended for:  Everyone born from [birth year] through 1964.  Anyone with known risk factors for hepatitis C. Sexually transmitted infections (STIs)  Get screened for STIs, including gonorrhea and chlamydia, if: ? You are sexually active and are younger than 62 years of age. ? You are older than 62 years of age and your health care provider tells you that you are at risk for this type of infection. ? Your sexual activity has changed since you were last screened, and you are at increased risk for chlamydia or gonorrhea. Ask your health care provider if you are at risk.  Ask your health care provider about whether you are at  high risk for HIV. Your health care provider may recommend a prescription medicine to help prevent HIV infection. If you choose to take medicine to prevent HIV, you should first get tested for HIV. You should then be tested every 3 months for as long as you are taking the medicine. Pregnancy  If you are about to stop having your period (premenopausal) and you may become pregnant, seek counseling before you get pregnant.  Take 400 to 800 micrograms (mcg) of folic acid every day if you become pregnant.  Ask for birth control (contraception) if you want to prevent pregnancy. Osteoporosis and menopause Osteoporosis is a disease in which the bones lose minerals and strength with aging. This can result in bone fractures. If you are 64 years old or older, or if you are at risk for osteoporosis and fractures, ask your health care provider if you should:  Be screened for bone loss.  Take a calcium or vitamin D supplement to lower your risk of fractures.  Be given hormone replacement therapy (HRT) to treat symptoms of menopause. Follow these instructions at home: Lifestyle  Do not use any products that contain nicotine or tobacco, such as cigarettes, e-cigarettes, and chewing tobacco. If you need help quitting, ask your health care provider.  Do not use street drugs.  Do not share needles.  Ask your health care provider for help if you need support or information about quitting drugs. Alcohol use  Do not drink alcohol if: ? Your health care provider tells you not to drink. ? You are pregnant, may be pregnant, or are planning to become pregnant.  If you drink alcohol: ? Limit how much you use to 0-1 drink a day. ? Limit intake if you are breastfeeding.  Be aware of how much alcohol is in your drink. In the [place] one drink equals one 12 oz bottle of beer (355 mL), one 5 oz glass of wine (148 mL), or one 1 oz glass of hard liquor (44 mL). General instructions  Schedule regular health,  dental, and eye exams.  Stay current with your vaccines.  Tell your health care provider if: ? You often feel depressed. ? You have ever been abused or do not feel safe at home. Summary  Adopting a healthy lifestyle and getting preventive care are important in promoting health and wellness.  Follow your health care provider's instructions about healthy diet, exercising, and getting tested or screened for diseases.  Follow your health care provider's instructions on monitoring your cholesterol and blood pressure. This information is not intended to replace advice given to you by your health care provider. Make sure you discuss any questions you have with your health care provider. Document Released: 01/23/2011 Document Revised: 07/03/2018 Document Reviewed: 07/03/2018 Elsevier Patient Education  2020 [employer].

## 2019-03-09 NOTE — Progress Notes (Addendum)
[facility] [address], [place], [place] [address] [phone]  Date:  03/17/2019   Name:  Patricia Rush   DOB:  05-25-1956   MRN:  [id number]  PCP:  [name], MD    Chief Complaint: Annual Exam   History of Present Illness:  Patricia Rush is a 63 y.o. 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 Ihor Dow, and she underwent colposcopy in July 2019.  Reviewed surgical pathology which is benign Per Dr. 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  There is a questionable family history of ovarian cancer in an elderly relative.  She would like to have a Ca-125 drawn for reassurance, understands there is no standard screening for ovarian cancer   BP Readings from Last 3 Encounters:  03/17/19 (!) 150/78  02/20/18 (!) 145/74  01/14/18 140/88   Wt Readings from Last 3  Encounters:  03/17/19 170 lb (77.1 kg)  02/20/18 168 lb (76.2 kg)  01/14/18 170 lb (77.1 kg)     Patient Active Problem List   Diagnosis Date Noted  . Borderline blood pressure 03/17/2019    Past Medical History:  Diagnosis Date  . Chicken pox   . [place] measles   . Seasonal allergies     Past Surgical History:  Procedure Laterality Date  . AUGMENTATION MAMMAPLASTY    . MANDIBLE SURGERY    . TONSILLECTOMY      Social History   Tobacco Use  . Smoking status: Never Smoker  . Smokeless tobacco: Never Used  Substance Use Topics  . Alcohol use: Never    Frequency: Never  . Drug use: Never    Family History  Problem Relation Age of Onset  . Alzheimer's disease Father     No Known Allergies  Medication list has been reviewed and updated.  Current Outpatient Medications on File Prior to Visit  Medication Sig Dispense Refill  . fexofenadine (ALLEGRA) 180 MG tablet Take 180 mg by mouth daily.    Marland Kitchen ibuprofen (ADVIL,MOTRIN) 600 MG tablet Take 600 mg by mouth every 6 (six) hours as needed.    . mometasone (NASONEX) 50 MCG/ACT nasal spray Place 2 sprays into the nose daily.    . Vitamin D, Ergocalciferol, 2000 units CAPS Take 2,000 Units by  mouth daily.     No current facility-administered medications on file prior to visit.     Review of Systems:  As per HPI- otherwise negative. No CP or SOB Not exercising as much since gyms are closed- she has not gained weight but wants to lose    Physical Examination: Vitals:   03/17/19 1015 03/17/19 1028  BP: (!) 174/90 (!) 150/78  Pulse: 86   Resp: 16   Temp: (!) 97.1 F (36.2 C)   SpO2: 98%    Vitals:   03/17/19 1015  Weight: 170 lb (77.1 kg)  Height: 5\' 6"  (1.676 m)   Body mass index is 27.44 kg/m. Ideal Body Weight: Weight in (lb) to have BMI = 25: 154.6  GEN: WDWN, NAD, Non-toxic, A & O x 3 HEENT: Atraumatic, Normocephalic. Neck supple. No masses, No LAD. Ears and Nose: No external deformity. CV: RRR,  No M/G/R. No JVD. No thrill. No extra heart sounds. PULM: CTA B, no wheezes, crackles, rhonchi. No retractions. No resp. distress. No accessory muscle use. ABD: S, NT, ND, +BS. No rebound. No HSM. EXTR: No c/c/e NEURO Normal gait.  PSYCH: Normally interactive. Conversant. Not depressed or anxious appearing.  Calm demeanor.  Breast: normal exam s/p implants, no masses/ dimpling/ discharge Pelvic: normal, no vaginal lesions or discharge. Uterus normal, no CMT, no adnexal tendereness or masses  Assessment and Plan: Physical exam  Screening for cervical cancer - Plan: Cytology - PAP  Screening for hyperlipidemia - Plan: Lipid panel  Screening for deficiency anemia - Plan: CBC  Screening for diabetes mellitus - Plan: Comprehensive metabolic panel, Hemoglobin A1c  Screening for breast cancer - Plan: MM 3D SCREEN BREAST W/IMPLANT BILATERAL, CANCELED: MM 3D SCREEN BREAST BILATERAL  Screening for HIV (human immunodeficiency virus) - Plan: HIV Antibody (routine testing w rflx)  Immunization due - Plan: Varicella-zoster vaccine IM (Shingrix)  Vitamin D deficiency - Plan: Vitamin D (25 hydroxy)  Abdominal bloating - Plan: CA 125  Borderline blood pressure   Here today for a CPE Labs as above Ordered mammo shingrix #1 Will get flu shot at work  Her BP is borderline- she will continue to monitor and let me know if elevated She may get some abd bloating at times, will run Ca-125  Signed Lamar Blinks, MD  Received labs so far, message to pt  Results for orders placed or performed in visit on 03/17/19  CBC  Result Value Ref Range   WBC 8.0 4.0 - 10.5 K/uL   RBC 4.98 3.87 - 5.11 Mil/uL   Platelets 306.0 150.0 - 400.0 K/uL   Hemoglobin 14.2 12.0 - 15.0 g/dL   HCT 42.8 36.0 - 46.0 %   MCV 85.8 78.0 - 100.0 fl   MCHC 33.2 30.0 - 36.0 g/dL   RDW 12.9 11.5 - 15.5 %  Comprehensive metabolic panel  Result Value Ref Range   Sodium 141 135 - 145 mEq/L   Potassium 3.9 3.5 - 5.1  mEq/L   Chloride 106 96 - 112 mEq/L   CO2 25 19 - 32 mEq/L   Glucose, Bld 95 70 - 99 mg/dL   BUN 15 6 - 23 mg/dL   Creatinine, Ser 0.65 0.40 - 1.20 mg/dL   Total Bilirubin 1.1 0.2 - 1.2 mg/dL   Alkaline Phosphatase 71 39 - 117 U/L   AST 14 0 - 37 U/L   ALT 16 0 - 35 U/L   Total Protein 6.5 6.0 - 8.3 g/dL   Albumin 4.5 3.5 -  5.2 g/dL   Calcium 9.6 8.4 - 10.5 mg/dL   GFR 92.44 >60.00 mL/min  Hemoglobin A1c  Result Value Ref Range   Hgb A1c MFr Bld 5.5 4.6 - 6.5 %  Lipid panel  Result Value Ref Range   Cholesterol 218 (H) 0 - 200 mg/dL   Triglycerides 74.0 0.0 - 149.0 mg/dL   HDL 56.70 >39.00 mg/dL   VLDL 14.8 0.0 - 40.0 mg/dL   LDL Cholesterol 147 (H) 0 - 99 mg/dL   Total CHOL/HDL Ratio 4    NonHDL 161.74   HIV Antibody (routine testing w rflx)  Result Value Ref Range   HIV 1&2 Ab, 4th Generation NON-REACTIVE NON-REACTI  Vitamin D (25 hydroxy)  Result Value Ref Range   VITD 17.55 (L) 30.00 - 100.00 ng/mL    Received further labs 8/25, message to patient Your HIV is negative as expected Vitamin D is low again.  I will send in a course of high-dose vitamin D supplement, 50,000 units once a week-please take this for 12 weeks, and then continue with approximately 2000 international units over-the-counter daily  Your lipids are reasonable, see estimated 10-year risk of cardiovascular disease calculated below  The 10-year ASCVD risk score Mikey Bussing DC Brooke Bonito., et al., 2013) is: 5.2%   Values used to calculate the score:     Age: 14 years     Sex: Female     Is Non-Hispanic African American: No     Diabetic: No     Tobacco smoker: No     Systolic Blood Pressure: 686 mmHg     Is BP treated: No     HDL Cholesterol: 56.7 mg/dL     Total Cholesterol: 168 mg/dL  Certainly we could start cholesterol medication if you wish, but I do not think compulsory at this point  Take care, let me know if any questions

## 2019-03-17 ENCOUNTER — Other Ambulatory Visit (HOSPITAL_COMMUNITY)
Admission: RE | Admit: 2019-03-17 | Discharge: 2019-03-17 | Disposition: A | Discharge status: Home / Self Care | Payer: 59 | Source: Ambulatory Visit | Attending: Family Medicine | Admitting: Family Medicine

## 2019-03-17 ENCOUNTER — Encounter: Payer: Self-pay | Admitting: Family Medicine

## 2019-03-17 ENCOUNTER — Ambulatory Visit (INDEPENDENT_AMBULATORY_CARE_PROVIDER_SITE_OTHER): Payer: 59 | Admitting: Family Medicine

## 2019-03-17 ENCOUNTER — Other Ambulatory Visit: Payer: Self-pay

## 2019-03-17 VITALS — BP 150/78 | HR 86 | Temp 97.1°F | Resp 16 | Ht 66.0 in | Wt 170.0 lb

## 2019-03-17 DIAGNOSIS — Z0000 Encounter for general adult medical examination without abnormal findings: Secondary | ICD-10-CM

## 2019-03-17 DIAGNOSIS — Z131 Encounter for screening for diabetes mellitus: Secondary | ICD-10-CM | POA: Diagnosis not present

## 2019-03-17 DIAGNOSIS — Z130 Encounter for screening for diseases of the blood and blood-forming organs and certain disorders involving the immune mechanism: Secondary | ICD-10-CM | POA: Diagnosis not present

## 2019-03-17 DIAGNOSIS — Z1239 Encounter for other screening for malignant neoplasm of breast: Secondary | ICD-10-CM

## 2019-03-17 DIAGNOSIS — Z23 Encounter for immunization: Secondary | ICD-10-CM

## 2019-03-17 DIAGNOSIS — Z114 Encounter for screening for human immunodeficiency virus [HIV]: Secondary | ICD-10-CM

## 2019-03-17 DIAGNOSIS — E559 Vitamin D deficiency, unspecified: Secondary | ICD-10-CM | POA: Diagnosis not present

## 2019-03-17 DIAGNOSIS — Z124 Encounter for screening for malignant neoplasm of cervix: Secondary | ICD-10-CM

## 2019-03-17 DIAGNOSIS — Z13220 Encounter for screening for lipoid disorders: Secondary | ICD-10-CM

## 2019-03-17 DIAGNOSIS — R030 Elevated blood-pressure reading, without diagnosis of hypertension: Secondary | ICD-10-CM

## 2019-03-17 DIAGNOSIS — R140 Abdominal distension (gaseous): Secondary | ICD-10-CM | POA: Diagnosis not present

## 2019-03-17 HISTORY — DX: Elevated blood-pressure reading, without diagnosis of hypertension: R03.0

## 2019-03-17 LAB — COMPREHENSIVE METABOLIC PANEL
ALT: 16 U/L (ref 0–35)
AST: 14 U/L (ref 0–37)
Albumin: 4.5 g/dL (ref 3.5–5.2)
Alkaline Phosphatase: 71 U/L (ref 39–117)
BUN: 15 mg/dL (ref 6–23)
CO2: 25 mEq/L (ref 19–32)
Calcium: 9.6 mg/dL (ref 8.4–10.5)
Chloride: 106 mEq/L (ref 96–112)
Creatinine, Ser: 0.65 mg/dL (ref 0.40–1.20)
GFR: 92.44 mL/min (ref >60.00)
Glucose, Bld: 95 mg/dL (ref 70–99)
Potassium: 3.9 mEq/L (ref 3.5–5.1)
Sodium: 141 mEq/L (ref 135–145)
Total Bilirubin: 1.1 mg/dL (ref 0.2–1.2)
Total Protein: 6.5 g/dL (ref 6.0–8.3)

## 2019-03-17 LAB — CBC
HCT: 42.8 % (ref 36.0–46.0)
Hemoglobin: 14.2 g/dL (ref 12.0–15.0)
MCHC: 33.2 g/dL (ref 30.0–36.0)
MCV: 85.8 fl (ref 78.0–100.0)
Platelets: 306 10*3/uL (ref 150.0–400.0)
RBC: 4.98 Mil/uL (ref 3.87–5.11)
RDW: 12.9 % (ref 11.5–15.5)
WBC: 8 10*3/uL (ref 4.0–10.5)

## 2019-03-17 LAB — LIPID PANEL
Cholesterol: 218 mg/dL — ABNORMAL HIGH (ref 0–200)
HDL: 56.7 mg/dL (ref >39.00)
LDL Cholesterol: 147 mg/dL — ABNORMAL HIGH (ref 0–99)
NonHDL: 161.74
Total CHOL/HDL Ratio: 4
Triglycerides: 74 mg/dL (ref 0.0–149.0)
VLDL: 14.8 mg/dL (ref 0.0–40.0)

## 2019-03-17 LAB — HEMOGLOBIN A1C: Hgb A1c MFr Bld: 5.5 % (ref 4.6–6.5)

## 2019-03-18 ENCOUNTER — Encounter: Payer: Self-pay | Admitting: Family Medicine

## 2019-03-18 LAB — CYTOLOGY - PAP
Diagnosis: NEGATIVE
HPV: NOT DETECTED

## 2019-03-18 LAB — VITAMIN D 25 HYDROXY (VIT D DEFICIENCY, FRACTURES): VITD: 17.55 ng/mL — ABNORMAL LOW (ref 30.00–100.00)

## 2019-03-18 MED ORDER — VITAMIN D3 1.25 MG (50000 UT) PO CAPS: ORAL_CAPSULE | ORAL | 0 refills | Status: DC

## 2019-03-18 MED FILL — VITAMIN D3 50000 UNIT CAPS: 1.25 MG | 84 days supply | Qty: 12 | Fill #0

## 2019-03-18 NOTE — Addendum Note (Signed)
Addended by: Lamar Blinks C on: 03/18/2019 11:52 AM   Modules accepted: Orders

## 2019-03-19 ENCOUNTER — Other Ambulatory Visit: Payer: Self-pay

## 2019-03-19 ENCOUNTER — Ambulatory Visit (HOSPITAL_BASED_OUTPATIENT_CLINIC_OR_DEPARTMENT_OTHER)
Admission: RE | Admit: 2019-03-19 | Discharge: 2019-03-19 | Disposition: A | Discharge status: Home / Self Care | Payer: 59 | Source: Ambulatory Visit | Attending: Family Medicine | Admitting: Family Medicine

## 2019-03-19 DIAGNOSIS — Z1239 Encounter for other screening for malignant neoplasm of breast: Secondary | ICD-10-CM

## 2019-03-19 DIAGNOSIS — Z1231 Encounter for screening mammogram for malignant neoplasm of breast: Secondary | ICD-10-CM | POA: Insufficient documentation

## 2019-03-19 LAB — HIV ANTIBODY (ROUTINE TESTING W REFLEX): HIV 1&2 Ab, 4th Generation: NONREACTIVE

## 2019-03-19 LAB — CA 125: CA 125: 10 U/mL (ref <35)

## 2019-08-28 MED FILL — SHINGRIX 50 MCG SUS: 50 | 1 days supply | Qty: 1 | Fill #0

## 2019-11-05 ENCOUNTER — Other Ambulatory Visit: Payer: Self-pay

## 2019-11-05 ENCOUNTER — Encounter (HOSPITAL_COMMUNITY): Payer: Self-pay | Admitting: Emergency Medicine

## 2019-11-05 ENCOUNTER — Emergency Department (HOSPITAL_COMMUNITY)
Admission: EM | Admit: 2019-11-05 | Discharge: 2019-11-05 | Disposition: A | Discharge status: Home / Self Care | Payer: 59 | Attending: Emergency Medicine | Admitting: Emergency Medicine

## 2019-11-05 ENCOUNTER — Emergency Department (HOSPITAL_COMMUNITY): Payer: 59

## 2019-11-05 DIAGNOSIS — R0789 Other chest pain: Secondary | ICD-10-CM | POA: Insufficient documentation

## 2019-11-05 DIAGNOSIS — Z79899 Other long term (current) drug therapy: Secondary | ICD-10-CM | POA: Diagnosis not present

## 2019-11-05 DIAGNOSIS — Z7982 Long term (current) use of aspirin: Secondary | ICD-10-CM | POA: Insufficient documentation

## 2019-11-05 DIAGNOSIS — I209 Angina pectoris, unspecified: Secondary | ICD-10-CM | POA: Insufficient documentation

## 2019-11-05 DIAGNOSIS — R200 Anesthesia of skin: Secondary | ICD-10-CM | POA: Diagnosis present

## 2019-11-05 DIAGNOSIS — R079 Chest pain, unspecified: Secondary | ICD-10-CM | POA: Diagnosis not present

## 2019-11-05 LAB — BASIC METABOLIC PANEL
Anion gap: 9 (ref 5–15)
BUN: 12 mg/dL (ref 8–23)
CO2: 27 mmol/L (ref 22–32)
Calcium: 9.4 mg/dL (ref 8.9–10.3)
Chloride: 105 mmol/L (ref 98–111)
Creatinine, Ser: 0.73 mg/dL (ref 0.44–1.00)
GFR calc Af Amer: >60 mL/min (ref >60)
GFR calc non Af Amer: >60 mL/min (ref >60)
Glucose, Bld: 99 mg/dL (ref 70–99)
Potassium: 4.3 mmol/L (ref 3.5–5.1)
Sodium: 141 mmol/L (ref 135–145)

## 2019-11-05 LAB — TROPONIN I (HIGH SENSITIVITY)
Troponin I (High Sensitivity): <2 ng/L (ref <18)
Troponin I (High Sensitivity): <2 ng/L (ref <18)

## 2019-11-05 LAB — CBC
HCT: 46.5 % — ABNORMAL HIGH (ref 36.0–46.0)
Hemoglobin: 15.2 g/dL — ABNORMAL HIGH (ref 12.0–15.0)
MCH: 29.2 pg (ref 26.0–34.0)
MCHC: 32.7 g/dL (ref 30.0–36.0)
MCV: 89.3 fL (ref 80.0–100.0)
Platelets: 318 10*3/uL (ref 150–400)
RBC: 5.21 MIL/uL — ABNORMAL HIGH (ref 3.87–5.11)
RDW: 13 % (ref 11.5–15.5)
WBC: 10.4 10*3/uL (ref 4.0–10.5)
nRBC: 0 % (ref 0.0–0.2)

## 2019-11-05 NOTE — ED Provider Notes (Signed)
[facility] DEPT Provider Note   CSN: [id number] Arrival date & time: 11/05/19  1047     History No chief complaint on file.   Patricia Rush is a 64 y.o. female.  HPI  HPI: A 64 year old patient with a history of hypercholesterolemia presents for evaluation of chest pain. Initial onset of pain was less than one hour ago. The patient's chest pain is described as heaviness/pressure/tightness and is not worse with exertion. The patient complains of nausea and reports some diaphoresis. The patient's chest pain is middle- or left-sided, is not well-localized, is not sharp and does radiate to the arms/jaw/neck. The patient has no history of stroke, has no history of peripheral artery disease, has not smoked in the past 90 days, denies any history of treated diabetes, has no relevant family history of coronary artery disease (first degree relative at less than age 35), is not hypertensive and does not have an elevated BMI (>=30).   Patient reports that her symptoms are coming and going.  She has no associated focal weakness, slurred speech, headaches, neck pain.  Patient has no history of chest pain or shortness of breath with exertion leading up to today.  Past Medical History:  Diagnosis Date  . Chicken pox   . Patricia Rush   . Seasonal allergies     Patient Active Problem List   Diagnosis Date Noted  . Borderline blood pressure 03/17/2019    Past Surgical History:  Procedure Laterality Date  . AUGMENTATION MAMMAPLASTY    . MANDIBLE SURGERY    . TONSILLECTOMY       OB History    Gravida  1   Para  1   Term  1   Preterm      AB      Living  1     SAB      TAB      Ectopic      Multiple      Live Births  1           Family History  Problem Relation Age of Onset  . Alzheimer's disease Father     Social History   Tobacco Use  . Smoking status: Never Smoker  . Smokeless tobacco: Never Used  Substance Use Topics  .  Alcohol use: Never  . Drug use: Never    Home Medications Prior to Admission medications   Medication Sig Start Date End Date Taking? Authorizing Provider  aspirin 81 MG chewable tablet Chew 324 mg by mouth daily as needed for mild pain or headache.   Yes [provider]  ibuprofen (ADVIL,MOTRIN) 600 MG tablet Take 600 mg by mouth every 6 (six) hours as needed for moderate pain.    Yes [provider]  Cholecalciferol (VITAMIN D3) 1.25 MG (50000 UT) CAPS Take 1 weekly for 12 weeks Patient not taking: Reported on 11/05/2019 03/18/19   Copland, Gay Filler, MD    Allergies    Patient has no known allergies.  Review of Systems   Review of Systems  Constitutional: Positive for activity change.  Cardiovascular: Positive for chest pain.  Musculoskeletal: Positive for arthralgias.  Neurological: Positive for numbness.  All other systems reviewed and are negative.   Physical Exam Updated Vital Signs BP (!) 142/70   Pulse 77   Temp 98.2 F (36.8 C) (Oral)   Resp 18   Ht 5\' 6"  (1.676 m)   Wt 77.6 kg   SpO2 97%   BMI  27.60 kg/m   Physical Exam Vitals and nursing note reviewed.  Constitutional:      Appearance: She is well-developed.  HENT:     Head: Normocephalic and atraumatic.  Eyes:     Extraocular Movements: Extraocular movements intact.     Pupils: Pupils are equal, round, and reactive to light.  Cardiovascular:     Rate and Rhythm: Normal rate.  Pulmonary:     Effort: Pulmonary effort is normal.  Abdominal:     General: Bowel sounds are normal.  Musculoskeletal:        General: Normal range of motion.     Cervical back: Normal range of motion and neck supple.  Skin:    General: Skin is warm and dry.  Neurological:     Mental Status: She is alert and oriented to person, place, and time.     Cranial Nerves: No cranial nerve deficit.     Sensory: No sensory deficit.     Motor: No weakness.     Coordination: Coordination normal.     ED Results /  Procedures / Treatments   Labs (all labs ordered are listed, but only abnormal results are displayed) Labs Reviewed  CBC - Abnormal; Notable for the following components:      Result Value   RBC 5.21 (*)    Hemoglobin 15.2 (*)    HCT 46.5 (*)    All other components within normal limits  BASIC METABOLIC PANEL  TROPONIN I (HIGH SENSITIVITY)  TROPONIN I (HIGH SENSITIVITY)    EKG None  Date: 11/05/2019  Rate: 78  Rhythm: normal sinus rhythm  QRS Axis: normal  Intervals: normal  ST/T Wave abnormalities: normal  Conduction Disutrbances: none  Narrative Interpretation: unremarkable   Radiology DG Chest 2 View  Result Date: 11/05/2019 CLINICAL DATA:  Substernal chest pain while driving to work today, arm, lip and thumb numbness, weakness EXAM: CHEST - 2 VIEW COMPARISON:  12/20/2016 FINDINGS: Normal heart size, mediastinal contours, and pulmonary vascularity. Minimal RIGHT apex scarring. Lungs otherwise clear. No pulmonary infiltrate, pleural effusion or pneumothorax. Bones unremarkable. IMPRESSION: No acute abnormalities. Electronically Signed   By: Lavonia Dana M.D.   On: 11/05/2019 11:59    Procedures Procedures (including critical care time)  Medications Ordered in ED Medications - No data to display  ED Course  I have reviewed the triage vital signs and the nursing notes.  Pertinent labs & imaging results that were available during my care of the patient were reviewed by me and considered in my medical decision making (see chart for details).    MDM Rules/Calculators/A&P HEAR Score: 4                    Differential diagnosis includes: ACS syndrome Myocarditis Pericarditis TIA  64 year old female comes in a chief complaint of left-sided chest discomfort.  Chest pain is radiating down her arm and she has some tingling to her face and her left upper extremity.  Her symptoms are intermittent.  Neuro exam is nonfocal.  Cardiovascular exam is reassuring.  EKG is not  showing any acute findings.  Patient is hear score is 4. She has been pain-free during my reassessment.  Patient prefers conservative approach of following up outpatient with cardiology service.  I have contacted cardiology team and they have taken patient's information down for a prompt follow-up.  Additionally we considered TIA as a possibility given the numbness in her left upper extremity and face.  Patient has agreed to follow-up with  her PCP to see if she needs primary neurologic work-up as well.  She will start taking aspirin every day and we have discussed strict ER return precautions.   Final Clinical Impression(s) / ED Diagnoses Final diagnoses:  Angina pectoris [facility])    Rx / DC Orders ED Discharge Orders    None       Varney Biles, MD 11/05/19 1658

## 2019-11-05 NOTE — ED Triage Notes (Signed)
Patient was driving into work today, had a left substernal 'heart twinge' with arm, lip and thumb numbness. Then felt relaxed. Came into work at [employer]. Experienced another episode of L hand and finger numbness so she decided to come to the ED.

## 2019-11-05 NOTE — Discharge Instructions (Addendum)
We signed the ER for symptoms that appeared to be angina.  The other possibility is we discussed was TIA.  Either way, your symptoms have resolved at this time and the work-up in the ER is reassuring.  You have agreed to close follow-up.  We have called cardiology service and they will reach out to you later today or by tomorrow with an appointment.  If you do not hear from them by tomorrow afternoon please call them and set up an appointment.  Additionally follow-up with your primary care doctor for consideration for possible TIA and subsequent work-up.  Please return to the ER if you have one-sided numbness, weakness, slurred speech, worsening chest pain, shortness of breath, pain radiating to your jaw, shoulder, or back, sweats or fainting.

## 2019-11-06 NOTE — Progress Notes (Deleted)
Referring-Jessica Copland, MD Reason for referral-chest pain  HPI: 64 year old female for evaluation of chest pain at request of Lamar Blinks, MD.  Patient seen in the emergency room on April 14 with chest pain.  Electrocardiogram by report normal.  Troponins negative.  Chest x-ray showed no infiltrate.  Hemoglobin 15.2.  Current Outpatient Medications  Medication Sig Dispense Refill  . aspirin 81 MG chewable tablet Chew 324 mg by mouth daily as needed for mild pain or headache.    . Cholecalciferol (VITAMIN D3) 1.25 MG (50000 UT) CAPS Take 1 weekly for 12 weeks (Patient not taking: Reported on 11/05/2019) 12 capsule 0  . ibuprofen (ADVIL,MOTRIN) 600 MG tablet Take 600 mg by mouth every 6 (six) hours as needed for moderate pain.      No current facility-administered medications for this visit.    No Known Allergies  Past Medical History:  Diagnosis Date  . Chicken pox   . [place] measles   . Seasonal allergies     Past Surgical History:  Procedure Laterality Date  . AUGMENTATION MAMMAPLASTY    . MANDIBLE SURGERY    . TONSILLECTOMY      Social History   Socioeconomic History  . Marital status: Married    Spouse name: Not on file  . Number of children: Not on file  . Years of education: Not on file  . Highest education level: Not on file  Occupational History  . Not on file  Tobacco Use  . Smoking status: Never Smoker  . Smokeless tobacco: Never Used  Substance and Sexual Activity  . Alcohol use: Never  . Drug use: Never  . Sexual activity: Yes    Birth control/protection: None  Other Topics Concern  . Not on file  Social History Narrative  . Not on file   Social Determinants of Health   Financial Resource Strain:   . Difficulty of Paying Living Expenses:   Food Insecurity:   . Worried About Charity fundraiser in the Last Year:   . Arboriculturist in the Last Year:   Transportation Needs:   . Film/video editor (Medical):   Marland Kitchen Lack of  Transportation (Non-Medical):   Physical Activity:   . Days of Exercise per Week:   . Minutes of Exercise per Session:   Stress:   . Feeling of Stress :   Social Connections:   . Frequency of Communication with Friends and Family:   . Frequency of Social Gatherings with Friends and Family:   . Attends Religious Services:   . Active Member of Clubs or Organizations:   . Attends Archivist Meetings:   Marland Kitchen Marital Status:   Intimate Partner Violence:   . Fear of Current or Ex-Partner:   . Emotionally Abused:   Marland Kitchen Physically Abused:   . Sexually Abused:     Family History  Problem Relation Age of Onset  . Alzheimer's disease Father     ROS: no fevers or chills, productive cough, hemoptysis, dysphasia, odynophagia, melena, hematochezia, dysuria, hematuria, rash, seizure activity, orthopnea, PND, pedal edema, claudication. Remaining systems are negative.  Physical Exam:   There were no vitals taken for this visit.  General:  Well developed/well nourished in NAD Skin warm/dry Patient not depressed No peripheral clubbing Back-normal HEENT-normal/normal eyelids Neck supple/normal carotid upstroke bilaterally; no bruits; no JVD; no thyromegaly chest - CTA/ normal expansion CV - RRR/normal S1 and S2; no murmurs, rubs or gallops;  PMI nondisplaced Abdomen -NT/ND, no  HSM, no mass, + bowel sounds, no bruit 2+ femoral pulses, no bruits Ext-no edema, chords, 2+ DP Neuro-grossly nonfocal  ECG - personally reviewed  A/P  1 chest pain-  2 hyperlipidemia-  Kirk Ruths, MD

## 2019-11-10 ENCOUNTER — Ambulatory Visit: Payer: 59 | Admitting: Cardiology

## 2019-11-12 ENCOUNTER — Other Ambulatory Visit: Payer: Self-pay

## 2019-11-12 ENCOUNTER — Ambulatory Visit: Payer: 59 | Admitting: Cardiology

## 2019-11-12 ENCOUNTER — Encounter: Payer: Self-pay | Admitting: Cardiology

## 2019-11-12 VITALS — BP 168/88 | HR 88 | Ht 66.0 in | Wt 172.0 lb

## 2019-11-12 DIAGNOSIS — I779 Disorder of arteries and arterioles, unspecified: Secondary | ICD-10-CM

## 2019-11-12 DIAGNOSIS — R0789 Other chest pain: Secondary | ICD-10-CM

## 2019-11-12 DIAGNOSIS — R072 Precordial pain: Secondary | ICD-10-CM

## 2019-11-12 DIAGNOSIS — I10 Essential (primary) hypertension: Secondary | ICD-10-CM

## 2019-11-12 DIAGNOSIS — E785 Hyperlipidemia, unspecified: Secondary | ICD-10-CM | POA: Insufficient documentation

## 2019-11-12 HISTORY — DX: Other chest pain: R07.89

## 2019-11-12 HISTORY — DX: Hyperlipidemia, unspecified: E78.5

## 2019-11-12 HISTORY — DX: Essential (primary) hypertension: I10

## 2019-11-12 MED ORDER — ATORVASTATIN CALCIUM 10 MG PO TABS: 10.0000 mg | ORAL_TABLET | Freq: Every day | ORAL | 3 refills | Status: DC

## 2019-11-12 MED ORDER — METOPROLOL TARTRATE 100 MG PO TABS: 100.0000 mg | ORAL_TABLET | ORAL | 0 refills | Status: DC

## 2019-11-12 MED ORDER — NITROGLYCERIN 0.4 MG SL SUBL: 0.4000 mg | SUBLINGUAL_TABLET | SUBLINGUAL | 3 refills | Status: DC | PRN

## 2019-11-12 MED FILL — NITROGLYCERIN 0.4 MG TAB SL: 0.4 | 10 days supply | Qty: 25 | Fill #0

## 2019-11-12 MED FILL — ATORVASTATIN 10 MG TABLET: 10 | 90 days supply | Qty: 90 | Fill #0

## 2019-11-12 MED FILL — METOPROLOL TARTRATE 100 MG: 100 | 1 days supply | Qty: 1 | Fill #0

## 2019-11-12 NOTE — Progress Notes (Signed)
Cardiology Consultation:    Date:  11/12/2019   ID:  [name], DOB 07-Dec-1955, MRN [id number]  PCP:  [name], MD  Cardiologist:  [name], MD   Referring MD: [name], MD   No chief complaint on file. I had a chest pain  History of Present Illness:    Patricia Rush is a 64 y.o. female who is being seen today for the evaluation of chest pain at the request of Copland, Gay Filler, MD.  She is a Software engineer.  1 day last week on Wednesday she was driving to her work and started having some chest sensation she graded this at 4 in scale at the time.  There was no shortness of breath no sweating associated with this.  It was associated with tingling in her left arm.  This sensation lasted for a few seconds.  She came to work and she was able to walk to work however she walk slowly because she was afraid to walk fast and then during today when she was sitting at the computer she started having the same sensation again again strength was about 4-5 and scale up to 10.  She did have a little sweating associated with this sensation that sensation lasted few minutes and eventually she decided to go to the emergency room.  That sensation was associated with also some numbness in her left lower leg as well as tingling in her left pinky.  Quite extensive evaluation has been done in the emergency room, EKG did not show any acute changes, troponin I high-sensitivity was negative.  She refused to have CT of her head.  Since that time she is doing well but she is afraid to push herself while exercising. Her risk factors include dyslipidemia: It looks like she also got hypertension.  She does not exercise on the regular basis but planning to.  Past Medical History:  Diagnosis Date  . Chicken pox   . [place] measles   . Seasonal allergies     Past Surgical History:  Procedure Laterality Date  . AUGMENTATION MAMMAPLASTY    . MANDIBLE SURGERY    . TONSILLECTOMY      Current  Medications: Current Meds  Medication Sig  . aspirin 81 MG chewable tablet Chew 324 mg by mouth daily as needed for mild pain or headache.  . ibuprofen (ADVIL,MOTRIN) 600 MG tablet Take 600 mg by mouth every 6 (six) hours as needed for moderate pain.      Allergies:   Patient has no known allergies.   Social History   Socioeconomic History  . Marital status: Married    Spouse name: Not on file  . Number of children: Not on file  . Years of education: Not on file  . Highest education level: Not on file  Occupational History  . Not on file  Tobacco Use  . Smoking status: Never Smoker  . Smokeless tobacco: Never Used  Substance and Sexual Activity  . Alcohol use: Never  . Drug use: Never  . Sexual activity: Yes    Birth control/protection: None  Other Topics Concern  . Not on file  Social History Narrative  . Not on file   Social Determinants of Health   Financial Resource Strain:   . Difficulty of Paying Living Expenses:   Food Insecurity:   . Worried About Charity fundraiser in the Last Year:   . [place] in the Last Year:  Transportation Needs:   . Film/video editor (Medical):   Marland Kitchen Lack of Transportation (Non-Medical):   Physical Activity:   . Days of Exercise per Week:   . Minutes of Exercise per Session:   Stress:   . Feeling of Stress :   Social Connections:   . Frequency of Communication with Friends and Family:   . Frequency of Social Gatherings with Friends and Family:   . Attends Religious Services:   . Active Member of Clubs or Organizations:   . Attends Archivist Meetings:   Marland Kitchen Marital Status:      Family History: The patient's family history includes Alzheimer's disease in her father. ROS:   Please see the history of present illness.    All 14 point review of systems negative except as described per history of present illness.  EKGs/Labs/Other Studies Reviewed:    The following studies were reviewed today: I did review  record from the emergency roo  EKG:  EKG is  ordered today.  The ekg ordered today demonstrates normal sinus rhythm, normal P interval, possible left atrial enlargement, no ST segment changes  Recent Labs: 03/17/2019: ALT 16 11/05/2019: BUN 12; Creatinine, Ser 0.73; Hemoglobin 15.2; Platelets 318; Potassium 4.3; Sodium 141  Recent Lipid Panel    Component Value Date/Time   CHOL 218 (H) 03/17/2019 1044   TRIG 74.0 03/17/2019 1044   HDL 56.70 03/17/2019 1044   CHOLHDL 4 03/17/2019 1044   VLDL 14.8 03/17/2019 1044   LDLCALC 147 (H) 03/17/2019 1044    Physical Exam:    VS:  BP (!) 168/88   Pulse 88   Ht 5\' 6"  (1.676 m)   Wt 172 lb (78 kg)   SpO2 98%   BMI 27.76 kg/m     Wt Readings from Last 3 Encounters:  11/12/19 172 lb (78 kg)  11/05/19 171 lb (77.6 kg)  03/17/19 170 lb (77.1 kg)     GEN:  Well nourished, well developed in no acute distress HEENT: Normal NECK: No JVD; No carotid bruits LYMPHATICS: No lymphadenopathy CARDIAC: RRR, no murmurs, no rubs, no gallops RESPIRATORY:  Clear to auscultation without rales, wheezing or rhonchi  ABDOMEN: Soft, non-tender, non-distended MUSCULOSKELETAL:  No edema; No deformity  SKIN: Warm and dry NEUROLOGIC:  Alert and oriented x 3 PSYCHIATRIC:  Normal affect   ASSESSMENT:    1. Atypical chest pain   2. Dyslipidemia   3. Essential hypertension    PLAN:    In order of problems listed above:  1. Atypical chest pain in this lady with some risk factors for coronary artery disease.  We had a long discussion about what to do with the situation we elected to proceed with coronary CT angio.  I explained procedure to her as well as alternatives meaning stress testing versus cardiac catheterization.  In terms of stress testing because of bandemia we cannot do exercise stress test therefore we missing prognostic value of the test.  In the meantime I will ask her to take nitroglycerin on as-needed basis, I will also put her on aspirin that  she is already taking as well as Lipitor 10 mg daily. 2. Dyslipidemia we will initiate Lipitor 10. 3. Essential hypertension she does have elevated blood pressure today.  She will watch her blood pressure in the future she may require some medication. 4. Tingling in the left arm as well as some tingling in the left leg.  I doubt really that this is TIA.  Usually TIA  does not go with a chest pain.  Still I think it would be reasonable to perform carotic ultrasounds make sure she does not have any significant cardiac arterial disease. 5. Next time when we meet we can talk about risk factors modification I told her test will be negative obviously if CT angiogram is positive then cardiac cath will be   Medication Adjustments/Labs and Tests Ordered: Current medicines are reviewed at length with the patient today.  Concerns regarding medicines are outlined above.  No orders of the defined types were placed in this encounter.  No orders of the defined types were placed in this encounter.   Signed, Park Liter, MD, [facility]. 11/12/2019 9:38 AM    [place]

## 2019-11-12 NOTE — Patient Instructions (Addendum)
Medication Instructions:  1) Start Atorvastatin (Lipitor) 10 mg daily   2) Start Nitroglycerin 0.4 mg every 5 minutes as needed for chest pain   *If you need a refill on your cardiac medications before your next appointment, please call your pharmacy*   Lab Work: Your physician recommends that you return for lab work 1 week prior to your Cardia CT (You do not need an appointmen, Our lab is oepn M-F from 8am to 5pm)  If you have labs (blood work) drawn today and your tests are completely normal, you will receive your results only by: Marland Kitchen MyChart Message (if you have MyChart) OR . A paper copy in the mail If you have any lab test that is abnormal or we need to change your treatment, we will call you to review the results.   Testing/Procedures: Your physician has requested that you have a carotid duplex. This test is an ultrasound of the carotid arteries in your neck. It looks at blood flow through these arteries that supply the brain with blood. Allow one hour for this exam. There are no restrictions or special instructions.  Your physician has ordered for you to have a Cardiac CT *Instructions Below*   Follow-Up: At [facility], you and your health needs are our priority.  As part of our continuing mission to provide you with exceptional heart care, we have created designated Provider Care Teams.  These Care Teams include your primary Cardiologist (physician) and Advanced Practice Providers (APPs -  Physician Assistants and Nurse Practitioners) who all work together to provide you with the care you need, when you need it.  We recommend signing up for the patient portal called "MyChart".  Sign up information is provided on this After Visit Summary.  MyChart is used to connect with patients for Virtual Visits (Telemedicine).  Patients are able to view lab/test results, encounter notes, upcoming appointments, etc.  Non-urgent messages can be sent to your provider as well.   To learn more  about what you can do with MyChart, go to [email or web address].    Your next appointment:   1 month(s)  The format for your next appointment:   In Person  Provider:   [name], MD   Other Instructions Your cardiac CT will be scheduled at one of the below locations:   [facility] [address] [place], [place] [address] [phone]  If scheduled at [facility], please arrive at the [facility] main entrance of [facility] 30 minutes prior to test start time. Proceed to the [facility] Radiology Department (first floor) to check-in and test prep.  Please follow these instructions carefully (unless otherwise directed):   On the Night Before the Test: . Be sure to Drink plenty of water. . Do not consume any caffeinated/decaffeinated beverages or chocolate 12 hours prior to your test. . Do not take any antihistamines 12 hours prior to your test.   On the Day of the Test: . Drink plenty of water. Do not drink any water within one hour of the test. . Do not eat any food 4 hours prior to the test. . You may take your regular medications prior to the test.  . Take metoprolol (Lopressor) two hours prior to test. . FEMALES- please wear underwire-free bra if available       After the Test: . Drink plenty of water. . After receiving IV contrast, you may experience a mild flushed feeling. This is normal. . On occasion, you  may experience a mild rash up to 24 hours after the test. This is not dangerous. If this occurs, you can take Benadryl 25 mg and increase your fluid intake. . If you experience trouble breathing, this can be serious. If it is severe call 911 IMMEDIATELY. If it is mild, please call our office.   Once we have confirmed authorization from your insurance company, we will call you to set up a date and time for your test.   For non-scheduling related questions, please contact the cardiac imaging nurse navigator should you have  any questions/concerns: Marchia Bond, RN Navigator Cardiac Imaging [facility] [phone] office  For scheduling needs, including cancellations and rescheduling, please call [phone].

## 2019-11-14 ENCOUNTER — Encounter: Payer: Self-pay | Admitting: Family Medicine

## 2019-11-18 ENCOUNTER — Ambulatory Visit (HOSPITAL_BASED_OUTPATIENT_CLINIC_OR_DEPARTMENT_OTHER)
Admission: RE | Admit: 2019-11-18 | Discharge: 2019-11-18 | Disposition: A | Discharge status: Home / Self Care | Payer: 59 | Source: Ambulatory Visit | Attending: Cardiology | Admitting: Cardiology

## 2019-11-18 DIAGNOSIS — I779 Disorder of arteries and arterioles, unspecified: Secondary | ICD-10-CM | POA: Diagnosis not present

## 2019-11-18 NOTE — Progress Notes (Signed)
VAS US CAROTID DUPLEX BILATERAL   11/18/19 Cardell Peach RDCS, RVT

## 2019-11-19 ENCOUNTER — Telehealth: Payer: Self-pay

## 2019-11-19 NOTE — Telephone Encounter (Signed)
-----   Message from Park Liter, MD sent at 11/19/2019  3:34 PM EDT ----- Carotic ultrasound looks good

## 2019-11-19 NOTE — Telephone Encounter (Signed)
LMTCB with questions regarding Carotid US

## 2019-11-19 NOTE — Progress Notes (Signed)
[place] [facility] [address], [place],  [address] [phone]  Date:  11/20/2019   Name:  Patricia Rush   DOB:  [birth year]   MRN:  [id number]  PCP:  [name], MD    Chief Complaint: Blood Pressure Concern (elevated, 168/94 at cardiology)   History of Present Illness:  Patricia Rush is a 64 y.o. 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  The ER doctor mentioned neurology work-up for possible TIA. At this point patient is not feeling concerned about a TIA, she does not particular want to follow-up with neurology  She saw her cardiologist, Dr. Raliegh Ip, 1 week ago 1. Atypical chest pain in this lady with some risk factors for coronary artery disease.  We had a long discussion about what to do with the situation we elected to proceed with coronary CT angio.  I explained procedure to her as well as alternatives meaning stress testing versus cardiac catheterization.  In terms of stress testing because of bandemia we cannot do exercise stress test therefore we missing prognostic value of the test.  In the meantime I will ask her to take nitroglycerin on as-needed basis, I will also put her on aspirin that she is already taking as well as Lipitor 10 mg daily. 2. Dyslipidemia we will initiate Lipitor 10. 3. Essential hypertension she does have elevated blood pressure today.  She will watch her blood pressure in the future she may require some medication. 4. Tingling in the left arm as well as some tingling in the left leg.  I doubt really that this is TIA.  Usually TIA does not go with a chest pain.  Still I think  it would be reasonable to perform carotic ultrasounds make sure she does not have any significant cardiac arterial disease. 5. Next time when we meet we can talk about risk factors modification I told her test will be negative obviously if CT angiogram is positive then cardiac cath will be   COVID-19 vaccine-done Tetanus booster- patient thinks she is up-to-date, will need to check with health  She had intermittent tingling in her left arm and her lip for one day when she was seen in the ER- this has not come back Her main concern today is that she notes a persistent discrepancy in blood pressure between her left and right arms. The left seems to be persistently 8-15 points higher than the right; she wonders why this might be    BP Readings from Last 3 Encounters:  11/20/19 (!) 144/79  11/12/19 (!) 168/88  11/05/19 (!) 142/70     Patient Active Problem List   Diagnosis Date Noted  . Atypical chest pain 11/12/2019  . Dyslipidemia 11/12/2019  . Essential hypertension 11/12/2019  . Borderline blood pressure 03/17/2019    Past Medical History:  Diagnosis Date  . Chicken pox   . [place] measles   . Seasonal allergies     Past Surgical History:  Procedure Laterality Date  . AUGMENTATION MAMMAPLASTY    . MANDIBLE SURGERY    . TONSILLECTOMY  Social History   Tobacco Use  . Smoking status: Never Smoker  . Smokeless tobacco: Never Used  Substance Use Topics  . Alcohol use: Never  . Drug use: Never    Family History  Problem Relation Age of Onset  . Alzheimer's disease Father     No Known Allergies  Medication list has been reviewed and updated.  Current Outpatient Medications on File Prior to Visit  Medication Sig Dispense Refill  . aspirin EC 81 MG tablet Take 81 mg by mouth daily.    Marland Kitchen atorvastatin (LIPITOR) 10 MG tablet Take 1 tablet (10 mg total) by mouth daily. 90 tablet 3  . ibuprofen (ADVIL,MOTRIN) 600 MG tablet Take 600 mg by mouth every 6 (six) hours  as needed for moderate pain.     . metoprolol tartrate (LOPRESSOR) 100 MG tablet Take 1 tablet (100 mg total) by mouth as directed. Take 2 hours prior to CT 1 tablet 0  . nitroGLYCERIN (NITROSTAT) 0.4 MG SL tablet Place 1 tablet (0.4 mg total) under the tongue every 5 (five) minutes as needed for chest pain. (Patient not taking: Reported on 11/20/2019) 25 tablet 3   No current facility-administered medications on file prior to visit.    Review of Systems:  As per HPI- otherwise negative.   Physical Examination: Vitals:   11/20/19 1121 11/20/19 1122  BP: (!) 165/103 (!) 144/79  Pulse: 91 92  Resp: 16   Temp: (!) 97.2 F (36.2 C)   SpO2: 96%    Vitals:   11/20/19 1121  Weight: 170 lb (77.1 kg)  Height: 5\' 6"  (1.676 m)   Body mass index is 27.44 kg/m. Ideal Body Weight: Weight in (lb) to have BMI = 25: 154.6  GEN: no acute distress.  Mild overweight, looks well  HEENT: Atraumatic, Normocephalic.  Ears and Nose: No external deformity. CV: RRR, No M/G/R. No JVD. No thrill. No extra heart sounds. PULM: CTA B, no wheezes, crackles, rhonchi. No retractions. No resp. distress. No accessory muscle use. ABD: S, NT, ND, +BS. No rebound. No HSM. EXTR: No c/c/e PSYCH: Normally interactive. Conversant.   Right arm 140/85 Left arm 152/85  She notes that her left arm BP is consistently higher at home as well   Good exercise tolerance, no CP or exertional symptoms She will walk uphill at 31mph for 30- 60 minutes at the gym Assessment and Plan: Essential hypertension - Plan: lisinopril (ZESTRIL) 10 MG tablet  Patient here today to follow-up from ER and to discuss blood pressure discrepancy between her 2 arms. Otherwise she is currently feeling well Recent carotid ultrasound normal She has a coronary angiogram pending Patient would like to go ahead and start on a low-dose of lisinopril due to mild hypertension. We will start her on 10 mg at this time  This visit occurred during the  SARS-CoV-2 public health emergency.  Safety protocols were in place, including screening questions prior to the visit, additional usage of staff PPE, and extensive cleaning of exam room while observing appropriate contact time as indicated for disinfecting solutions.   Moderate medical decision making today Signed Lamar Blinks, MD

## 2019-11-20 ENCOUNTER — Ambulatory Visit: Payer: 59 | Admitting: Family Medicine

## 2019-11-20 ENCOUNTER — Other Ambulatory Visit: Payer: Self-pay

## 2019-11-20 ENCOUNTER — Other Ambulatory Visit: Payer: Self-pay | Admitting: Family Medicine

## 2019-11-20 VITALS — BP 144/79 | HR 92 | Temp 97.2°F | Resp 16 | Ht 66.0 in | Wt 170.0 lb

## 2019-11-20 DIAGNOSIS — I10 Essential (primary) hypertension: Secondary | ICD-10-CM

## 2019-11-20 MED ORDER — LISINOPRIL 10 MG PO TABS: 10.0000 mg | ORAL_TABLET | Freq: Every day | ORAL | 3 refills | Status: DC

## 2019-11-20 MED FILL — LISINOPRIL 10 MG TABS: 10 | 90 days supply | Qty: 90 | Fill #0

## 2019-11-20 NOTE — Patient Instructions (Signed)
Great to see you today as always  Let us have you start on 10 mg of lisinopril daily, let me know if any concerns  I am going to investigate any further evaluation that may be needed for the discrepancy of your blood pressure between right and left arms

## 2019-11-30 ENCOUNTER — Encounter: Payer: Self-pay | Admitting: Family Medicine

## 2019-12-02 ENCOUNTER — Ambulatory Visit (HOSPITAL_COMMUNITY)
Admission: RE | Admit: 2019-12-02 | Discharge: 2019-12-02 | Disposition: A | Discharge status: Home / Self Care | Payer: 59 | Source: Ambulatory Visit | Attending: Cardiology | Admitting: Cardiology

## 2019-12-02 ENCOUNTER — Other Ambulatory Visit: Payer: Self-pay

## 2019-12-02 DIAGNOSIS — R072 Precordial pain: Secondary | ICD-10-CM | POA: Insufficient documentation

## 2019-12-02 IMAGING — CT CT HEART MORP W/ CTA COR W/ SCORE W/ CA W/CM &/OR W/O CM
4 of 7 series · 8 of 20 positions shown, 9 images · IV contrast (APPLIED)
Comparison: None.
COMPARISON: None.

Addendum:
EXAM:
OVER-READ INTERPRETATION  CT CHEST

The following report is an over-read performed by radiologist Dr.
Neto Gillum [[email or web address]] on 12/02/2019. This
over-read does not include interpretation of cardiac or coronary
anatomy or pathology. The coronary calcium score/coronary CTA
interpretation by the cardiologist is attached.
HISTORY: Chest pain, nonspecific
Cardiac/Coronary  CT
TECHNIQUE: The patient was scanned on a Siemens Force scanner.
PROTOCOL: A 120 kV prospective scan was triggered in the descending thoracic
aorta at 111 HU's. Axial non-contrast 3 mm slices were carried out
through the heart. The data set was analyzed on a dedicated work
station and scored using the Agatson method. Gantry rotation speed
was 250 msecs and collimation was .6 mm. Beta blockade and 0.8 mg of
sl NTG was given. The 3D data set was reconstructed in 5% intervals
of the 67-82 % of the R-R cycle. Diastolic phases were analyzed on a
dedicated work station using MPR, MIP and VRT modes. The patient
received 100mL OMNIPAQUE IOHEXOL 350 MG/ML SOLN of contrast.

[Series 6: best diast 75 % · axial · 0.39mm/px · z∈[-190,-149]mm · 2 of 308 slices shown, 3 images]
[im 103/308  vessel]
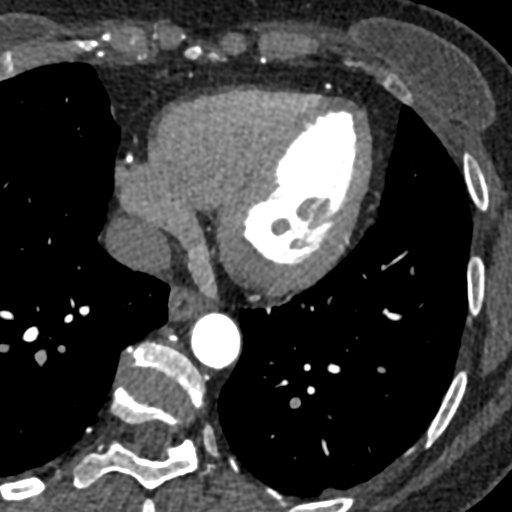
[im 103/308  lung]
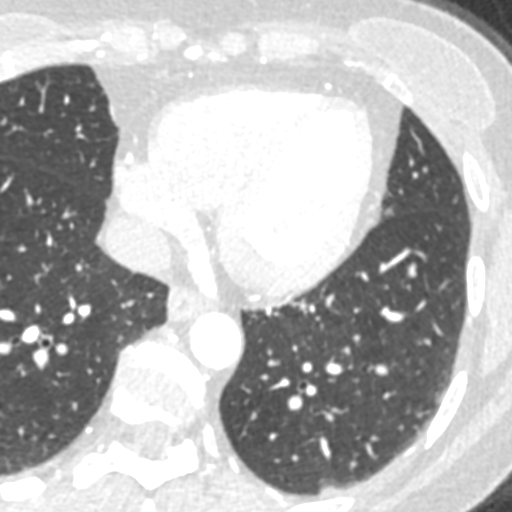
[im 205/308  vessel]
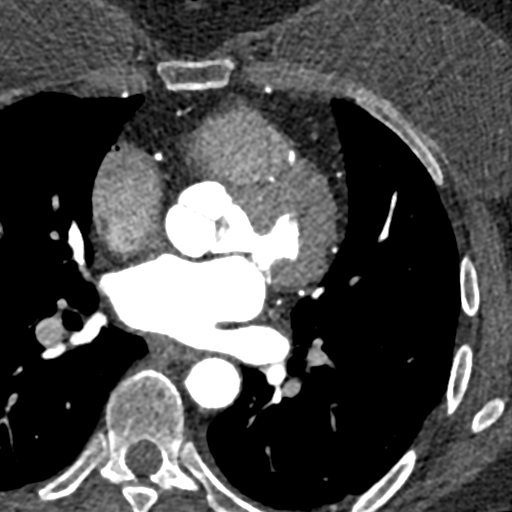

[Series 7: best syst 39 % · axial · 0.39mm/px · z∈[-190,-149]mm · 2 of 308 slices shown]
[im 103/308  vessel]
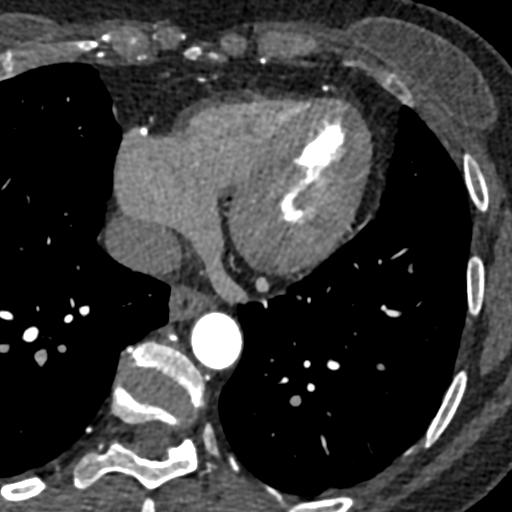
[im 205/308  vessel]
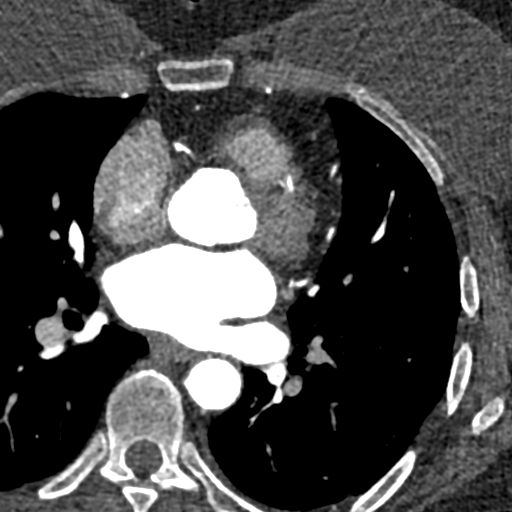

[Series 8: ts diast sharp 39 % · axial · 0.39mm/px · z∈[-190,-149]mm · 2 of 308 slices shown]
[im 103/308  lung]
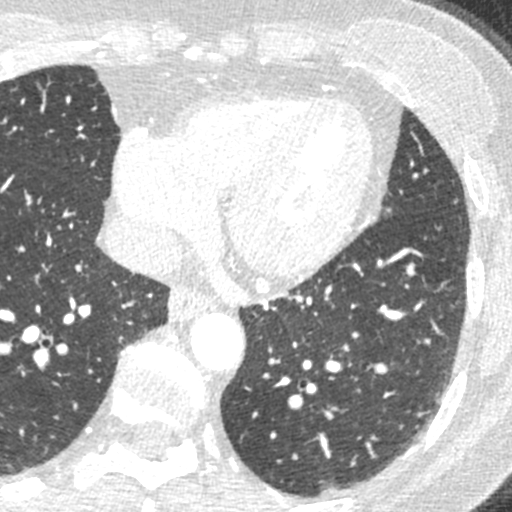
[im 205/308  lung]
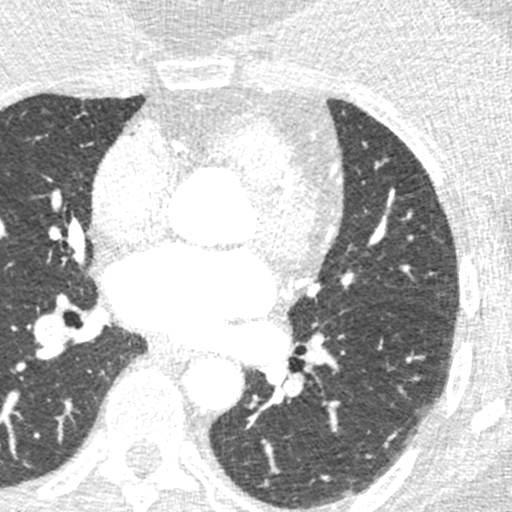

[Series 9: ts syst sharp 39 % · axial · 0.39mm/px · z∈[-190,-149]mm · 2 of 308 slices shown]
[im 103/308  lung]
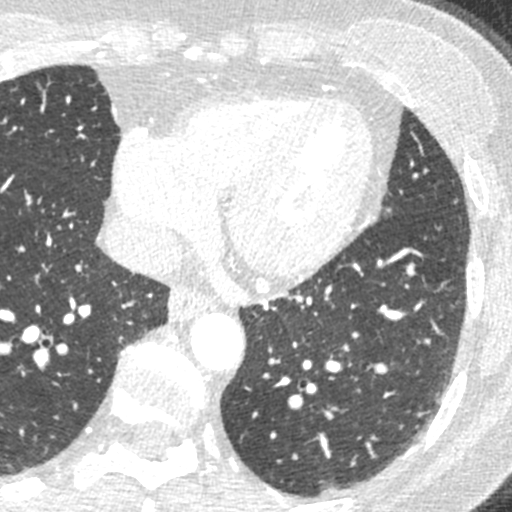
[im 205/308  lung]
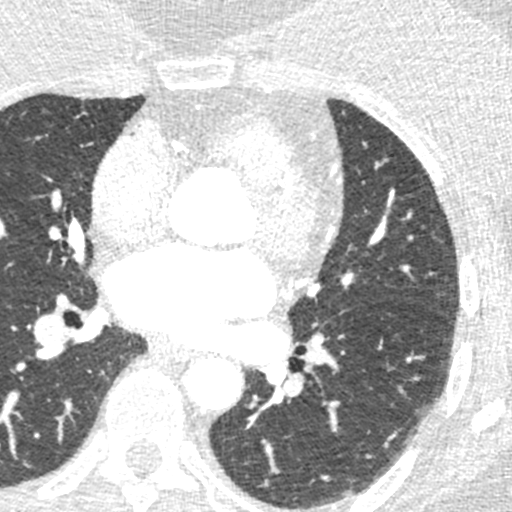

[8 of 20 positions shown; findings below may reference images not displayed]

FINDINGS: Within the visualized portions of the thorax there are no suspicious
appearing pulmonary nodules or masses, there is no acute
consolidative airspace disease, no pleural effusions, no
pneumothorax and no lymphadenopathy. Visualized portions of the
upper abdomen are unremarkable. There are no aggressive appearing
lytic or blastic lesions noted in the visualized portions of the
skeleton. Bilateral breast implants are incidentally noted.
IMPRESSION: No significant incidental noncardiac findings are noted
FINDINGS: Coronary calcium score is 162, which places the patient in the 90th
percentile for age and sex matched control.

Coronary arteries: Normal coronary origins.  Right dominance.

Right Coronary Artery: Minimal mixed atherosclerotic plaque in the
proximal RCA.

Left Main Coronary Artery: No detectable plaque or stenosis.

Left Anterior Descending Coronary Artery: Moderate mixed
atherosclerotic plaque in the proximal LAD, 50-69% stenosis. Mild
mixed atherosclerotic plaque in the proximal to mid LAD, 25-49%
stenosis.

Left Circumflex Artery: Mild mixed atherosclerotic plaque in the
proximal left circumflex, 25-49% stenosis.

Ramus intermedius: Small caliber ramus intermedius branch without
plaque or stenosis.

Aorta: Normal size, 32 mm at the mid ascending aorta (level of the
PA bifurcation) measured double oblique. No calcifications. No
dissection.

Aortic Valve: No calcifications.

Other findings:

Normal pulmonary vein drainage into the left atrium.

Normal left atrial appendage without a thrombus.

Normal size of the pulmonary artery.
IMPRESSION: 1. Moderate CAD in the proximal LAD, CADRADS = 3. CT FFR will be
performed and reported separately.

2. Coronary calcium score of 162. This was 90th percentile for age
and sex matched control.

3. Normal coronary origin with right dominance.

*** End of Addendum ***
EXAM:
OVER-READ INTERPRETATION  CT CHEST

The following report is an over-read performed by radiologist Dr.
Neto Gillum [REDACTED] on 12/02/2019. This
over-read does not include interpretation of cardiac or coronary
anatomy or pathology. The coronary calcium score/coronary CTA
interpretation by the cardiologist is attached.
FINDINGS: Within the visualized portions of the thorax there are no suspicious
appearing pulmonary nodules or masses, there is no acute
consolidative airspace disease, no pleural effusions, no
pneumothorax and no lymphadenopathy. Visualized portions of the
upper abdomen are unremarkable. There are no aggressive appearing
lytic or blastic lesions noted in the visualized portions of the
skeleton. Bilateral breast implants are incidentally noted.
IMPRESSION: No significant incidental noncardiac findings are noted

## 2019-12-02 MED ORDER — METOPROLOL TARTRATE 5 MG/5ML IV SOLN: 5.0000 mg | INTRAVENOUS | Status: DC | PRN

## 2019-12-02 MED ORDER — NITROGLYCERIN 0.4 MG SL SUBL
SUBLINGUAL_TABLET | SUBLINGUAL | Status: AC
  Filled 2019-12-02: qty 2

## 2019-12-02 MED ORDER — IOHEXOL 350 MG/ML SOLN: 100.0000 mL | Freq: Once | INTRAVENOUS | Status: DC | PRN

## 2019-12-02 MED ORDER — IOHEXOL 350 MG/ML SOLN
100.0000 mL | Freq: Once | INTRAVENOUS | Status: AC | PRN
  Administered 2019-12-02: 100 mL via INTRAVENOUS

## 2019-12-02 MED ORDER — NITROGLYCERIN 0.4 MG SL SUBL
0.8000 mg | SUBLINGUAL_TABLET | Freq: Once | SUBLINGUAL | Status: AC
  Administered 2019-12-02: 0.8 mg via SUBLINGUAL

## 2019-12-03 ENCOUNTER — Ambulatory Visit (HOSPITAL_COMMUNITY)
Admission: RE | Admit: 2019-12-03 | Discharge: 2019-12-03 | Disposition: A | Discharge status: Home / Self Care | Payer: 59 | Source: Ambulatory Visit | Attending: Cardiology | Admitting: Cardiology

## 2019-12-03 DIAGNOSIS — R072 Precordial pain: Secondary | ICD-10-CM | POA: Diagnosis not present

## 2019-12-04 DIAGNOSIS — R072 Precordial pain: Secondary | ICD-10-CM | POA: Diagnosis not present

## 2019-12-09 ENCOUNTER — Ambulatory Visit: Payer: 59 | Admitting: Cardiology

## 2019-12-11 ENCOUNTER — Other Ambulatory Visit: Payer: Self-pay

## 2019-12-12 ENCOUNTER — Other Ambulatory Visit: Payer: Self-pay

## 2019-12-12 ENCOUNTER — Ambulatory Visit (INDEPENDENT_AMBULATORY_CARE_PROVIDER_SITE_OTHER): Payer: 59 | Admitting: Cardiology

## 2019-12-12 ENCOUNTER — Encounter: Payer: Self-pay | Admitting: Cardiology

## 2019-12-12 VITALS — BP 140/70 | HR 90 | Ht 66.0 in | Wt 172.0 lb

## 2019-12-12 DIAGNOSIS — R0789 Other chest pain: Secondary | ICD-10-CM | POA: Diagnosis not present

## 2019-12-12 DIAGNOSIS — I10 Essential (primary) hypertension: Secondary | ICD-10-CM | POA: Diagnosis not present

## 2019-12-12 DIAGNOSIS — E785 Hyperlipidemia, unspecified: Secondary | ICD-10-CM

## 2019-12-12 DIAGNOSIS — I2510 Atherosclerotic heart disease of native coronary artery without angina pectoris: Secondary | ICD-10-CM

## 2019-12-12 HISTORY — DX: Atherosclerotic heart disease of native coronary artery without angina pectoris: I25.10

## 2019-12-12 MED ORDER — ATORVASTATIN CALCIUM 40 MG PO TABS: 40.0000 mg | ORAL_TABLET | Freq: Every day | ORAL | 1 refills | Status: DC

## 2019-12-12 MED FILL — ATORVASTATIN CALCIUM 40 MG: 40 | 90 days supply | Qty: 90 | Fill #0

## 2019-12-12 NOTE — Progress Notes (Signed)
Cardiology Office Note:    Date:  12/12/2019   ID:  Patricia Rush, DOB 06/05/1956, MRN [id number]  PCP:  [name], MD  Cardiologist:  [name], MD    Referring MD: [name], MD   Chief Complaint  Patient presents with  . Follow-up  Doing well  History of Present Illness:    Patricia Rush is a 64 y.o. female who came to me originally because of chest pain.  Pain happened and she was driving a car first after that she was able to walk climb stairs with no difficulties, she had another episode of pain when she was sitting on the desk and working.  She does have some risk factors for coronary artery disease, namely pulmonary hypertension as well as dyslipidemia.  I did coronary CT angiogram which showed 50 to 69% stenosis of the proximal LAD as well as 25 to 49% stenosis in the midportion of LAD, there was also 25 to 49% stenosis of the mid circumflex, right coronary artery was normal, left main coronary artery was normal.  After that she got fractional flow reserve done which showed proximal flexion flow reserve of LAD of 0.9 to mid 0.89 distal 0.82, circumflex artery proximal FFR was 0.94, distal was not met, proximal right coronary artery had 0.9 to mid 0.89 and distal 0.86.  There was no hemodynamically significant stenosis that being identified.  Since the time I see her last time she is doing well.  She denies have any chest pain tightness squeezing pressure burning chest but at the same time she does not exercise on a regular basis.  Past Medical History:  Diagnosis Date  . Atypical chest pain 11/12/2019  . Borderline blood pressure 03/17/2019  . Chicken pox   . Dyslipidemia 11/12/2019  . Essential hypertension 11/12/2019  . [place] measles   . Seasonal allergies     Past Surgical History:  Procedure Laterality Date  . AUGMENTATION MAMMAPLASTY    . MANDIBLE SURGERY    . TONSILLECTOMY      Current Medications: Current Meds  Medication Sig  . aspirin  EC 81 MG tablet Take 81 mg by mouth daily.  Marland Kitchen atorvastatin (LIPITOR) 10 MG tablet Take 1 tablet (10 mg total) by mouth daily.  . Cholecalciferol (VITAMIN D) 125 MCG (5000 UT) CAPS Take 250 mcg by mouth daily.  Marland Kitchen ibuprofen (ADVIL,MOTRIN) 600 MG tablet Take 600 mg by mouth every 6 (six) hours as needed for moderate pain.   Marland Kitchen lisinopril (ZESTRIL) 10 MG tablet Take 1 tablet (10 mg total) by mouth daily.  . mometasone (NASONEX) 50 MCG/ACT nasal spray Place 1 spray into the nose daily.  . nitroGLYCERIN (NITROSTAT) 0.4 MG SL tablet Place 1 tablet (0.4 mg total) under the tongue every 5 (five) minutes as needed for chest pain.     Allergies:   Patient has no known allergies.   Social History   Socioeconomic History  . Marital status: Married    Spouse name: Not on file  . Number of children: Not on file  . Years of education: Not on file  . Highest education level: Not on file  Occupational History  . Not on file  Tobacco Use  . Smoking status: Never Smoker  . Smokeless tobacco: Never Used  Substance and Sexual Activity  . Alcohol use: Never  . Drug use: Never  . Sexual activity: Yes    Birth control/protection: None  Other Topics Concern  . Not on file  Social History Narrative  . Not on file   Social Determinants of Health   Financial Resource Strain:   . Difficulty of Paying Living Expenses:   Food Insecurity:   . Worried About Charity fundraiser in the Last Year:   . Arboriculturist in the Last Year:   Transportation Needs:   . Film/video editor (Medical):   Marland Kitchen Lack of Transportation (Non-Medical):   Physical Activity:   . Days of Exercise per Week:   . Minutes of Exercise per Session:   Stress:   . Feeling of Stress :   Social Connections:   . Frequency of Communication with Friends and Family:   . Frequency of Social Gatherings with Friends and Family:   . Attends Religious Services:   . Active Member of Clubs or Organizations:   . Attends Archivist  Meetings:   Marland Kitchen Marital Status:      Family History: The patient's family history includes Alzheimer's disease in her father. ROS:   Please see the history of present illness.    All 14 point review of systems negative except as described per history of present illness  EKGs/Labs/Other Studies Reviewed:      Recent Labs: 03/17/2019: ALT 16 11/05/2019: BUN 12; Creatinine, Ser 0.73; Hemoglobin 15.2; Platelets 318; Potassium 4.3; Sodium 141  Recent Lipid Panel    Component Value Date/Time   CHOL 218 (H) 03/17/2019 1044   TRIG 74.0 03/17/2019 1044   HDL 56.70 03/17/2019 1044   CHOLHDL 4 03/17/2019 1044   VLDL 14.8 03/17/2019 1044   LDLCALC 147 (H) 03/17/2019 1044    Physical Exam:    VS:  BP 140/70   Pulse 90   Ht 5' 6"  (1.676 m)   Wt 172 lb (78 kg)   SpO2 98%   BMI 27.76 kg/m     Wt Readings from Last 3 Encounters:  12/12/19 172 lb (78 kg)  11/20/19 170 lb (77.1 kg)  11/12/19 172 lb (78 kg)     GEN:  Well nourished, well developed in no acute distress HEENT: Normal NECK: No JVD; No carotid bruits LYMPHATICS: No lymphadenopathy CARDIAC: RRR, no murmurs, no rubs, no gallops RESPIRATORY:  Clear to auscultation without rales, wheezing or rhonchi  ABDOMEN: Soft, non-tender, non-distended MUSCULOSKELETAL:  No edema; No deformity  SKIN: Warm and dry LOWER EXTREMITIES: no swelling NEUROLOGIC:  Alert and oriented x 3 PSYCHIATRIC:  Normal affect   ASSESSMENT:    1. Coronary artery disease involving native coronary artery of native heart without angina pectoris   2. Essential hypertension   3. Atypical chest pain   4. Dyslipidemia    PLAN:    In order of problems listed above:  1. Coronary disease status post coronary CT angio with lesions up to 60% in proximal LAD, fractional flow reserve after that showed no significant stenosis.  Therefore BP management will be restarted his medications.  We talked in length about avoidance of greasy food, we did talk about basic  of Mediterranean diet, we did talk about need to exercise 5 times a week for 30 minutes or so 150 minutes a week.  She understands she is eager to do that.  She is already on aspirin which I will continue, I will also intensify her statin therapy she is taking 10 mg of Lipitor B- 40 mg of Lipitor will check her fasting lipid profile within the 6 weeks. 2. Essential hypertension blood pressure appears to be borderline with present  medications.  I will continue following that in the future we may consider adding beta-blocker. 3. Atypical chest pain denies having any since last time.  Evaluation as described above. 4. Dyslipidemia still not well controlled plan will be to increase dose of Lipitor from 10 to 40 mg and follow-up.   Medication Adjustments/Labs and Tests Ordered: Current medicines are reviewed at length with the patient today.  Concerns regarding medicines are outlined above.  No orders of the defined types were placed in this encounter.  Medication changes: No orders of the defined types were placed in this encounter.   Signed, Park Liter, MD, [facility] 12/12/2019 11:51 AM    [place]

## 2019-12-12 NOTE — Patient Instructions (Signed)
Medication Instructions:  Your physician has recommended you make the following change in your medication:   INCREASE: Lipitor 40 mg daily   *If you need a refill on your cardiac medications before your next appointment, please call your pharmacy*   Lab Work: Your physician recommends that you return for lab work in 6 weeks fasting: lipid, lft   If you have labs (blood work) drawn today and your tests are completely normal, you will receive your results only by: Marland Kitchen MyChart Message (if you have MyChart) OR . A paper copy in the mail If you have any lab test that is abnormal or we need to change your treatment, we will call you to review the results.   Testing/Procedures: None.    Follow-Up: At [facility], you and your health needs are our priority.  As part of our continuing mission to provide you with exceptional heart care, we have created designated Provider Care Teams.  These Care Teams include your primary Cardiologist (physician) and Advanced Practice Providers (APPs -  Physician Assistants and Nurse Practitioners) who all work together to provide you with the care you need, when you need it.  We recommend signing up for the patient portal called "MyChart".  Sign up information is provided on this After Visit Summary.  MyChart is used to connect with patients for Virtual Visits (Telemedicine).  Patients are able to view lab/test results, encounter notes, upcoming appointments, etc.  Non-urgent messages can be sent to your provider as well.   To learn more about what you can do with MyChart, go to [email or web address].    Your next appointment:   3 month(s)  The format for your next appointment:   In Person  Provider:   [name], MD   Other Instructions

## 2020-01-27 DIAGNOSIS — E785 Hyperlipidemia, unspecified: Secondary | ICD-10-CM | POA: Diagnosis not present

## 2020-01-28 LAB — HEPATIC FUNCTION PANEL
ALT: 20 IU/L (ref 0–32)
AST: 16 IU/L (ref 0–40)
Albumin: 4.7 g/dL (ref 3.8–4.8)
Alkaline Phosphatase: 87 IU/L (ref 48–121)
Bilirubin Total: 1 mg/dL (ref 0.0–1.2)
Bilirubin, Direct: 0.22 mg/dL (ref 0.00–0.40)
Total Protein: 7 g/dL (ref 6.0–8.5)

## 2020-01-28 LAB — LIPID PANEL
Chol/HDL Ratio: 2.8 ratio (ref 0.0–4.4)
Cholesterol, Total: 142 mg/dL (ref 100–199)
HDL: 50 mg/dL (ref >39)
LDL Chol Calc (NIH): 79 mg/dL (ref 0–99)
Triglycerides: 61 mg/dL (ref 0–149)
VLDL Cholesterol Cal: 13 mg/dL (ref 5–40)

## 2020-01-30 ENCOUNTER — Telehealth: Payer: Self-pay | Admitting: Cardiology

## 2020-01-30 DIAGNOSIS — E785 Hyperlipidemia, unspecified: Secondary | ICD-10-CM

## 2020-01-30 NOTE — Telephone Encounter (Signed)
Patient returned call for lab results.  She said you can leave a detailed VM.

## 2020-01-30 NOTE — Telephone Encounter (Signed)
LMTCB with questions regarding lab results & sent a mychart message with her results as well.

## 2020-02-03 ENCOUNTER — Other Ambulatory Visit: Payer: Self-pay | Admitting: Cardiology

## 2020-02-03 MED ORDER — ATORVASTATIN CALCIUM 80 MG PO TABS: 80.0000 mg | ORAL_TABLET | Freq: Every day | ORAL | 1 refills | Status: DC

## 2020-02-03 NOTE — Telephone Encounter (Signed)
Called patient and informed her of her lab results and Dr. Wendy Poet recommendation to start lipitor 80 mg daily and have labs rechecked in 6 weeks. She verbally understood no further questions.

## 2020-02-03 NOTE — Addendum Note (Signed)
Addended by: Ashok Norris on: 02/03/2020 08:28 AM   Modules accepted: Orders

## 2020-03-19 DIAGNOSIS — E785 Hyperlipidemia, unspecified: Secondary | ICD-10-CM | POA: Diagnosis not present

## 2020-03-20 LAB — LIPID PANEL
Chol/HDL Ratio: 2.7 ratio (ref 0.0–4.4)
Cholesterol, Total: 122 mg/dL (ref 100–199)
HDL: 46 mg/dL (ref >39)
LDL Chol Calc (NIH): 53 mg/dL (ref 0–99)
Triglycerides: 129 mg/dL (ref 0–149)
VLDL Cholesterol Cal: 23 mg/dL (ref 5–40)

## 2020-03-22 ENCOUNTER — Encounter: Payer: Self-pay | Admitting: Cardiology

## 2020-03-22 ENCOUNTER — Ambulatory Visit: Payer: 59 | Admitting: Cardiology

## 2020-03-22 ENCOUNTER — Other Ambulatory Visit: Payer: Self-pay

## 2020-03-22 VITALS — BP 120/72 | HR 76 | Ht 66.0 in | Wt 165.0 lb

## 2020-03-22 DIAGNOSIS — R0789 Other chest pain: Secondary | ICD-10-CM

## 2020-03-22 DIAGNOSIS — I2510 Atherosclerotic heart disease of native coronary artery without angina pectoris: Secondary | ICD-10-CM

## 2020-03-22 DIAGNOSIS — E785 Hyperlipidemia, unspecified: Secondary | ICD-10-CM | POA: Diagnosis not present

## 2020-03-22 DIAGNOSIS — I10 Essential (primary) hypertension: Secondary | ICD-10-CM | POA: Diagnosis not present

## 2020-03-22 NOTE — Patient Instructions (Signed)

## 2020-03-22 NOTE — Progress Notes (Signed)
Cardiology Office Note:    Date:  03/22/2020   ID:  Patricia Rush, DOB 09-10-1955, MRN [id number]  PCP:  [name], MD  Cardiologist:  [name], MD    Referring MD: [name], MD   Chief Complaint  Patient presents with  . Follow-up  Doing fine  History of Present Illness:    Patricia Rush is a 64 y.o. female with past medical history significant for coronary artery disease, she did have coronary CT angio showing multiple but hemodynamically insignificant lesions, essential hypertension, dyslipidemia.  Comes today to my office for follow-up.  Overall doing well denies having any exertional chest pain tightness squeezing pressure burning chest.  The only situation when she described having some chest pains when she is in height.  She never tried nitroglycerin for it.  At the same time she is able to exercise well climb stairs with no difficulties.  Overall she is happy the way she feels.  Past Medical History:  Diagnosis Date  . Atypical chest pain 11/12/2019  . Borderline blood pressure 03/17/2019  . Chicken pox   . Dyslipidemia 11/12/2019  . Essential hypertension 11/12/2019  . [place] measles   . Seasonal allergies     Past Surgical History:  Procedure Laterality Date  . AUGMENTATION MAMMAPLASTY    . MANDIBLE SURGERY    . TONSILLECTOMY      Current Medications: Current Meds  Medication Sig  . aspirin EC 81 MG tablet Take 81 mg by mouth daily.  Marland Kitchen atorvastatin (LIPITOR) 80 MG tablet Take 1 tablet (80 mg total) by mouth daily.  . Cholecalciferol (VITAMIN D) 125 MCG (5000 UT) CAPS Take 250 mcg by mouth daily.  Marland Kitchen ibuprofen (ADVIL,MOTRIN) 600 MG tablet Take 600 mg by mouth every 6 (six) hours as needed for moderate pain.   Marland Kitchen lisinopril (ZESTRIL) 10 MG tablet Take 1 tablet (10 mg total) by mouth daily.  . mometasone (NASONEX) 50 MCG/ACT nasal spray Place 1 spray into the nose daily.  . nitroGLYCERIN (NITROSTAT) 0.4 MG SL tablet Place 1 tablet (0.4 mg  total) under the tongue every 5 (five) minutes as needed for chest pain.     Allergies:   Patient has no known allergies.   Social History   Socioeconomic History  . Marital status: Married    Spouse name: Not on file  . Number of children: Not on file  . Years of education: Not on file  . Highest education level: Not on file  Occupational History  . Not on file  Tobacco Use  . Smoking status: Never Smoker  . Smokeless tobacco: Never Used  Substance and Sexual Activity  . Alcohol use: Never  . Drug use: Never  . Sexual activity: Yes    Birth control/protection: None  Other Topics Concern  . Not on file  Social History Narrative  . Not on file   Social Determinants of Health   Financial Resource Strain:   . Difficulty of Paying Living Expenses: Not on file  Food Insecurity:   . Worried About Charity fundraiser in the Last Year: Not on file  . Ran Out of Food in the Last Year: Not on file  Transportation Needs:   . Lack of Transportation (Medical): Not on file  . Lack of Transportation (Non-Medical): Not on file  Physical Activity:   . Days of Exercise per Week: Not on file  . Minutes of Exercise per Session: Not on file  Stress:   .  Feeling of Stress : Not on file  Social Connections:   . Frequency of Communication with Friends and Family: Not on file  . Frequency of Social Gatherings with Friends and Family: Not on file  . Attends Religious Services: Not on file  . Active Member of Clubs or Organizations: Not on file  . Attends Archivist Meetings: Not on file  . Marital Status: Not on file     Family History: The patient's family history includes Alzheimer's disease in her father. ROS:   Please see the history of present illness.    All 14 point review of systems negative except as described per history of present illness  EKGs/Labs/Other Studies Reviewed:      Recent Labs: 11/05/2019: BUN 12; Creatinine, Ser 0.73; Hemoglobin 15.2; Platelets  318; Potassium 4.3; Sodium 141 01/27/2020: ALT 20  Recent Lipid Panel    Component Value Date/Time   CHOL 122 03/19/2020 1542   TRIG 129 03/19/2020 1542   HDL 46 03/19/2020 1542   CHOLHDL 2.7 03/19/2020 1542   CHOLHDL 4 03/17/2019 1044   VLDL 14.8 03/17/2019 1044   LDLCALC 53 03/19/2020 1542    Physical Exam:    VS:  BP 120/72 (BP Location: Left Arm, Patient Position: Sitting, Cuff Size: Normal)   Pulse 76   Ht 5\' 6"  (1.676 m)   Wt 165 lb (74.8 kg)   SpO2 99%   BMI 26.63 kg/m     Wt Readings from Last 3 Encounters:  03/22/20 165 lb (74.8 kg)  12/12/19 172 lb (78 kg)  11/20/19 170 lb (77.1 kg)     GEN:  Well nourished, well developed in no acute distress HEENT: Normal NECK: No JVD; No carotid bruits LYMPHATICS: No lymphadenopathy CARDIAC: RRR, no murmurs, no rubs, no gallops RESPIRATORY:  Clear to auscultation without rales, wheezing or rhonchi  ABDOMEN: Soft, non-tender, non-distended MUSCULOSKELETAL:  No edema; No deformity  SKIN: Warm and dry LOWER EXTREMITIES: no swelling NEUROLOGIC:  Alert and oriented x 3 PSYCHIATRIC:  Normal affect   ASSESSMENT:    1. Coronary artery disease involving native coronary artery of native heart without angina pectoris   2. Essential hypertension   3. Dyslipidemia   4. Atypical chest pain    PLAN:    In order of problems listed above:  1. Coronary artery disease multiple lesions detected on coronary CT angio, but none of this appears to be hemodynamically significant.  LAD proximal portion got FFR 0.92, midportion of 0.89, distal portion of 0.82.  Left circumflex have proximal portion 0.94, RCA proximal 0.92, mid 0.89, distal 0.86.  None of this is hemodynamically significant we will continue conservative approach. 2. Essential hypertension blood pressure well controlled continue present management. 3. Dyslipidemia: Last fasting lipid profile reviewed, she is on high intensity statin which I will continue.  Her LDL was 53, HDL  46, triglycerides 129.  Does have excellent cholesterol profile.  We will continue present management. 4. Atypical chest pain in a rare occasions while she is in hot tub.  I told her to let me know if this became more frequent. 5. Like always we talk about healthy lifestyle including exercises on the regular basis as well as good diet.  She is trying to do that, however, lately she was taking care of her sick mother, her mother is improving she has had to be more time for herself and she will exercise more aggressively.   Medication Adjustments/Labs and Tests Ordered: Current medicines are reviewed at  length with the patient today.  Concerns regarding medicines are outlined above.  No orders of the defined types were placed in this encounter.  Medication changes: No orders of the defined types were placed in this encounter.   Signed, Park Liter, MD, [facility] 03/22/2020 8:24 AM    [place]

## 2020-03-25 MED FILL — LISINOPRIL 10 MG TABS: 10 | 90 days supply | Qty: 90 | Fill #1

## 2020-03-25 MED FILL — ATORVASTATIN 80 MG TABLET: 80 | 90 days supply | Qty: 90 | Fill #0

## 2020-04-26 ENCOUNTER — Encounter (HOSPITAL_BASED_OUTPATIENT_CLINIC_OR_DEPARTMENT_OTHER): Payer: Self-pay | Admitting: *Deleted

## 2020-04-26 ENCOUNTER — Other Ambulatory Visit: Payer: Self-pay

## 2020-04-26 ENCOUNTER — Emergency Department (HOSPITAL_BASED_OUTPATIENT_CLINIC_OR_DEPARTMENT_OTHER)
Admission: EM | Admit: 2020-04-26 | Discharge: 2020-04-27 | Disposition: A | Discharge status: Home / Self Care | Payer: 59 | Attending: Emergency Medicine | Admitting: Emergency Medicine

## 2020-04-26 DIAGNOSIS — R519 Headache, unspecified: Secondary | ICD-10-CM | POA: Insufficient documentation

## 2020-04-26 DIAGNOSIS — I10 Essential (primary) hypertension: Secondary | ICD-10-CM | POA: Diagnosis not present

## 2020-04-26 DIAGNOSIS — Z7982 Long term (current) use of aspirin: Secondary | ICD-10-CM | POA: Diagnosis not present

## 2020-04-26 DIAGNOSIS — Z79899 Other long term (current) drug therapy: Secondary | ICD-10-CM | POA: Diagnosis not present

## 2020-04-26 DIAGNOSIS — I2510 Atherosclerotic heart disease of native coronary artery without angina pectoris: Secondary | ICD-10-CM | POA: Diagnosis not present

## 2020-04-26 DIAGNOSIS — W19XXXA Unspecified fall, initial encounter: Secondary | ICD-10-CM | POA: Diagnosis not present

## 2020-04-26 DIAGNOSIS — M549 Dorsalgia, unspecified: Secondary | ICD-10-CM | POA: Diagnosis not present

## 2020-04-26 DIAGNOSIS — R55 Syncope and collapse: Secondary | ICD-10-CM | POA: Insufficient documentation

## 2020-04-26 DIAGNOSIS — R5381 Other malaise: Secondary | ICD-10-CM | POA: Insufficient documentation

## 2020-04-26 DIAGNOSIS — R110 Nausea: Secondary | ICD-10-CM | POA: Insufficient documentation

## 2020-04-26 DIAGNOSIS — S0083XA Contusion of other part of head, initial encounter: Secondary | ICD-10-CM | POA: Insufficient documentation

## 2020-04-26 DIAGNOSIS — Y92002 Bathroom of unspecified non-institutional (private) residence single-family (private) house as the place of occurrence of the external cause: Secondary | ICD-10-CM | POA: Insufficient documentation

## 2020-04-26 LAB — CBC
HCT: 42 % (ref 36.0–46.0)
Hemoglobin: 14.1 g/dL (ref 12.0–15.0)
MCH: 28.8 pg (ref 26.0–34.0)
MCHC: 33.6 g/dL (ref 30.0–36.0)
MCV: 85.9 fL (ref 80.0–100.0)
Platelets: 213 10*3/uL (ref 150–400)
RBC: 4.89 MIL/uL (ref 3.87–5.11)
RDW: 12.3 % (ref 11.5–15.5)
WBC: 8 10*3/uL (ref 4.0–10.5)
nRBC: 0 % (ref 0.0–0.2)

## 2020-04-26 LAB — URINALYSIS, ROUTINE W REFLEX MICROSCOPIC
Bilirubin Urine: NEGATIVE
Glucose, UA: NEGATIVE mg/dL
Hgb urine dipstick: NEGATIVE
Ketones, ur: NEGATIVE mg/dL
Leukocytes,Ua: NEGATIVE
Nitrite: NEGATIVE
Protein, ur: NEGATIVE mg/dL
Specific Gravity, Urine: >1.03 — ABNORMAL HIGH (ref 1.005–1.030)
pH: 5 (ref 5.0–8.0)

## 2020-04-26 LAB — BASIC METABOLIC PANEL
Anion gap: 10 (ref 5–15)
BUN: 16 mg/dL (ref 8–23)
CO2: 23 mmol/L (ref 22–32)
Calcium: 8.9 mg/dL (ref 8.9–10.3)
Chloride: 101 mmol/L (ref 98–111)
Creatinine, Ser: 0.76 mg/dL (ref 0.44–1.00)
GFR calc Af Amer: >60 mL/min (ref >60)
GFR calc non Af Amer: >60 mL/min (ref >60)
Glucose, Bld: 125 mg/dL — ABNORMAL HIGH (ref 70–99)
Potassium: 3.4 mmol/L — ABNORMAL LOW (ref 3.5–5.1)
Sodium: 134 mmol/L — ABNORMAL LOW (ref 135–145)

## 2020-04-26 NOTE — ED Triage Notes (Addendum)
Pt states she rec'd her covid booster shot yesterday. Today, she has had fevers, chills, vomiting. Pt reports she got up to go the restroom, reports she had a syncopal episode, woke up on the tile floor of the bathroom. She hit the left cheek area when she fell, no blood thinners.

## 2020-04-27 ENCOUNTER — Other Ambulatory Visit (HOSPITAL_BASED_OUTPATIENT_CLINIC_OR_DEPARTMENT_OTHER): Payer: Self-pay | Admitting: Emergency Medicine

## 2020-04-27 DIAGNOSIS — M549 Dorsalgia, unspecified: Secondary | ICD-10-CM | POA: Diagnosis not present

## 2020-04-27 DIAGNOSIS — R519 Headache, unspecified: Secondary | ICD-10-CM | POA: Diagnosis not present

## 2020-04-27 DIAGNOSIS — R55 Syncope and collapse: Secondary | ICD-10-CM | POA: Diagnosis not present

## 2020-04-27 DIAGNOSIS — I10 Essential (primary) hypertension: Secondary | ICD-10-CM | POA: Diagnosis not present

## 2020-04-27 DIAGNOSIS — Z79899 Other long term (current) drug therapy: Secondary | ICD-10-CM | POA: Diagnosis not present

## 2020-04-27 DIAGNOSIS — R110 Nausea: Secondary | ICD-10-CM | POA: Diagnosis not present

## 2020-04-27 DIAGNOSIS — Z7982 Long term (current) use of aspirin: Secondary | ICD-10-CM | POA: Diagnosis not present

## 2020-04-27 DIAGNOSIS — R5381 Other malaise: Secondary | ICD-10-CM | POA: Diagnosis not present

## 2020-04-27 DIAGNOSIS — I2510 Atherosclerotic heart disease of native coronary artery without angina pectoris: Secondary | ICD-10-CM | POA: Diagnosis not present

## 2020-04-27 LAB — CBG MONITORING, ED: Glucose-Capillary: 117 mg/dL — ABNORMAL HIGH (ref 70–99)

## 2020-04-27 MED ORDER — ONDANSETRON 4 MG PO TBDP
4.0000 mg | ORAL_TABLET | Freq: Three times a day (TID) | ORAL | 0 refills | Status: DC | PRN
Start: 2020-04-27 — End: 2020-04-27

## 2020-04-27 MED ORDER — ONDANSETRON 4 MG PO TBDP
4.0000 mg | ORAL_TABLET | Freq: Once | ORAL | Status: AC
  Administered 2020-04-27: 4 mg via ORAL
  Filled 2020-04-27: qty 1

## 2020-04-27 MED ORDER — SODIUM CHLORIDE 0.9 % IV SOLN
1000.0000 mL | INTRAVENOUS | Status: DC
  Administered 2020-04-27: 1000 mL via INTRAVENOUS

## 2020-04-27 MED ORDER — SODIUM CHLORIDE 0.9 % IV BOLUS (SEPSIS)
1000.0000 mL | Freq: Once | INTRAVENOUS | Status: AC
  Administered 2020-04-27: 1000 mL via INTRAVENOUS

## 2020-04-27 MED ORDER — ACETAMINOPHEN 500 MG PO TABS
1000.0000 mg | ORAL_TABLET | Freq: Once | ORAL | Status: AC
  Administered 2020-04-27: 1000 mg via ORAL
  Filled 2020-04-27: qty 2

## 2020-04-27 MED FILL — ONDANSETRON ODT 4 MG TABLET: 4 | 5 days supply | Qty: 15 | Fill #0

## 2020-04-27 NOTE — ED Notes (Signed)
Checked CBG 117 RN informed

## 2020-04-27 NOTE — ED Provider Notes (Signed)
[facility] EMERGENCY DEPARTMENT Provider Note  CSN: [id number] Arrival date & time: 04/26/20 2036  Chief Complaint(s) Loss of Consciousness  HPI Patricia Rush is a 64 y.o. female here for syncopal episode Patient reports that she got her Covid vaccine yesterday.  This morning she began to feel malaise.  She is also had some nausea. Today at 7 PM she felt nauseated and threw up.  While throwing up, patient passed out, falling onto the floor and hitting her face.  She denied any associated chest pain or shortness of breath.  No diarrhea.  No abdominal pain.  She is complaining of facial pain and upper back pain.  No other physical complaints.  HPI      Past Medical History Past Medical History:  Diagnosis Date  . Atypical chest pain 11/12/2019  . Borderline blood pressure 03/17/2019  . Chicken pox   . Dyslipidemia 11/12/2019  . Essential hypertension 11/12/2019  . [place] measles   . Seasonal allergies    Patient Active Problem List   Diagnosis Date Noted  . Coronary artery disease 12/12/2019  . Atypical chest pain 11/12/2019  . Dyslipidemia 11/12/2019  . Essential hypertension 11/12/2019  . Borderline blood pressure 03/17/2019   Home Medication(s) Prior to Admission medications   Medication Sig Start Date End Date Taking? Authorizing Provider  aspirin EC 81 MG tablet Take 81 mg by mouth daily.    [provider]  atorvastatin (LIPITOR) 80 MG tablet Take 1 tablet (80 mg total) by mouth daily. 02/03/20   Park Liter, MD  Cholecalciferol (VITAMIN D) 125 MCG (5000 UT) CAPS Take 250 mcg by mouth daily.    [provider]  ibuprofen (ADVIL,MOTRIN) 600 MG tablet Take 600 mg by mouth every 6 (six) hours as needed for moderate pain.     [provider]  lisinopril (ZESTRIL) 10 MG tablet Take 1 tablet (10 mg total) by mouth daily. 11/20/19   Copland, Gay Filler, MD  mometasone (NASONEX) 50 MCG/ACT nasal spray Place 1 spray into the nose daily.     [provider]  nitroGLYCERIN (NITROSTAT) 0.4 MG SL tablet Place 1 tablet (0.4 mg total) under the tongue every 5 (five) minutes as needed for chest pain. 11/12/19 03/22/20  Park Liter, MD  ondansetron (ZOFRAN ODT) 4 MG disintegrating tablet Take 1 tablet (4 mg total) by mouth every 8 (eight) hours as needed for up to 3 days for nausea or vomiting. 04/27/20 04/30/20  Fatima Blank, MD                                                                                                                                    Past Surgical History Past Surgical History:  Procedure Laterality Date  . AUGMENTATION MAMMAPLASTY    . MANDIBLE SURGERY    . TONSILLECTOMY     Family History Family History  Problem Relation Age of Onset  .  Alzheimer's disease Father     Social History Social History   Tobacco Use  . Smoking status: Never Smoker  . Smokeless tobacco: Never Used  Substance Use Topics  . Alcohol use: Never  . Drug use: Never   Allergies Patient has no known allergies.  Review of Systems Review of Systems All other systems are reviewed and are negative for acute change except as noted in the HPI  Physical Exam Vital Signs  I have reviewed the triage vital signs BP 101/67 (BP Location: Left Arm)   Pulse 82   Temp 98.3 F (36.8 C) (Oral)   Resp 14   Ht 5\' 6"  (1.676 m)   Wt 74.8 kg   SpO2 97%   BMI 26.63 kg/m   Physical Exam Vitals reviewed.  Constitutional:      General: She is not in acute distress.    Appearance: She is well-developed. She is not diaphoretic.  HENT:     Head: Normocephalic. Contusion present. No raccoon eyes or Battle's sign.      Nose: Nose normal.  Eyes:     General: No scleral icterus.       Right eye: No discharge.        Left eye: No discharge.     Conjunctiva/sclera: Conjunctivae normal.     Pupils: Pupils are equal, round, and reactive to light.  Cardiovascular:     Rate and Rhythm: Normal rate and regular rhythm.      Heart sounds: No murmur heard.  No friction rub. No gallop.   Pulmonary:     Effort: Pulmonary effort is normal. No respiratory distress.     Breath sounds: Normal breath sounds. No stridor. No rales.  Abdominal:     General: There is no distension.     Palpations: Abdomen is soft.     Tenderness: There is no abdominal tenderness.  Musculoskeletal:     Cervical back: Normal range of motion and neck supple. Spasms and tenderness present. No bony tenderness. No spinous process tenderness or muscular tenderness.       Back:  Skin:    General: Skin is warm and dry.     Findings: No erythema or rash.  Neurological:     Mental Status: She is alert and oriented to person, place, and time.     ED Results and Treatments Labs (all labs ordered are listed, but only abnormal results are displayed) Labs Reviewed  BASIC METABOLIC PANEL - Abnormal; Notable for the following components:      Result Value   Sodium 134 (*)    Potassium 3.4 (*)    Glucose, Bld 125 (*)    All other components within normal limits  URINALYSIS, ROUTINE W REFLEX MICROSCOPIC - Abnormal; Notable for the following components:   Specific Gravity, Urine >1.030 (*)    All other components within normal limits  CBG MONITORING, ED - Abnormal; Notable for the following components:   Glucose-Capillary 117 (*)    All other components within normal limits  CBC  EKG  EKG Interpretation  Date/Time:  Monday April 26 2020 20:55:53 EDT Ventricular Rate:  106 PR Interval:  134 QRS Duration: 90 QT Interval:  324 QTC Calculation: 430 R Axis:   84 Text Interpretation: Sinus tachycardia with Fusion complexes Otherwise normal ECG NO STEMI. No old tracing to compare Confirmed by Addison Lank [phone]) on 04/27/2020 12:28:36 AM      Radiology No results found.  Pertinent labs & imaging results that  were available during my care of the patient were reviewed by me and considered in my medical decision making (see chart for details).  Medications Ordered in ED Medications  sodium chloride 0.9 % bolus 1,000 mL (0 mLs Intravenous Stopped 04/27/20 0203)    Followed by  0.9 %  sodium chloride infusion (0 mLs Intravenous Stopped 04/27/20 0334)  acetaminophen (TYLENOL) tablet 1,000 mg (1,000 mg Oral Given 04/27/20 0130)  ondansetron (ZOFRAN-ODT) disintegrating tablet 4 mg (4 mg Oral Given 04/27/20 0131)                                                                                                                                    Procedures .Critical Care Performed by: Fatima Blank, MD Authorized by: Fatima Blank, MD     CRITICAL CARE Performed by: Grayce Sessions [place] Total critical care time: 50 minutes Critical care time was exclusive of separately billable procedures and treating other patients. Critical care was necessary to treat or prevent imminent or life-threatening deterioration. Critical care was time spent personally by me on the following activities: development of treatment plan with patient and/or surrogate as well as nursing, discussions with consultants, evaluation of patient's response to treatment, examination of patient, obtaining history from patient or surrogate, ordering and performing treatments and interventions, ordering and review of laboratory studies, ordering and review of radiographic studies, pulse oximetry and re-evaluation of patient's condition.    (including critical care time)  Medical Decision Making / ED Course I have reviewed the nursing notes for this encounter and the patient's prior records (if available in EHR or on provided paperwork).   Patricia Rush was evaluated in Emergency Department on 04/27/2020 for the symptoms described in the history of present illness. She was evaluated in the context of the global COVID-19  pandemic, which necessitated consideration that the patient might be at risk for infection with the SARS-CoV-2 virus that causes COVID-19. Institutional protocols and algorithms that pertain to the evaluation of patients at risk for COVID-19 are in a state of rapid change based on information released by regulatory bodies including the CDC and federal and state organizations. These policies and algorithms were followed during the patient's care in the ED.  Patient presents after a syncopal episode in the setting of emesis. EKG grossly reassuring without significant dysrhythmias or blocks. UA concentrated suspicious for mild dehydration.  Metabolic panel with mild hyponatremia and hypokalemia.  Otherwise reassuring with no renal sufficiency.  CBC without leukocytosis or anemia.  Patient was orthostatic.  Provided with 2 L of IV fluids and oral hydration.  Also provided with Zofran.  Patient was able to tolerate oral intake.  After hydration, patient's tachycardia from above resolved.  She was able to ambulate without complication.     Final Clinical Impression(s) / ED Diagnoses Final diagnoses:  Vasovagal syncope    The patient appears reasonably screened and/or stabilized for discharge and I doubt any other medical condition or other [facility] requiring further screening, evaluation, or treatment in the ED at this time prior to discharge. Safe for discharge with strict return precautions.  Disposition: Discharge  Condition: Good  I have discussed the results, Dx and Tx plan with the patient/family who expressed understanding and agree(s) with the plan. Discharge instructions discussed at length. The patient/family was given strict return precautions who verbalized understanding of the instructions. No further questions at time of discharge.    ED Discharge Orders         Ordered    ondansetron (ZOFRAN ODT) 4 MG disintegrating tablet  Every 8 hours PRN        04/27/20 0423          Follow  Up: [name], MD [address] [place] [address] [phone]  Schedule an appointment as soon as possible for a visit  As needed     This chart was dictated using voice recognition software.  Despite best efforts to proofread,  errors can occur which can change the documentation meaning.   Fatima Blank, MD 04/27/20 [phone]

## 2020-05-14 ENCOUNTER — Ambulatory Visit: Payer: 59

## 2020-05-17 ENCOUNTER — Encounter: Payer: Self-pay | Admitting: Family Medicine

## 2020-05-17 NOTE — Telephone Encounter (Signed)
Spoke with patient and advised vaccination has been updated, pt was not sure about the date for Tdap but has date at home and will send updated dates via patient portal.

## 2020-05-18 NOTE — Progress Notes (Signed)
[facility], [place], [place] [address] [phone]  Date:  05/24/2020   Name:  Patricia Rush   DOB:  11-26-1955   MRN:  [id number]  PCP:  [name], MD    Chief Complaint: Shoulder Pain (right should pain, limited ROM, numbness radiating down arm) and Fall (4 weeks ago, hit face, experiencing facial pain)   History of Present Illness:  Patricia Rush is a 64 y.o. very pleasant female patient who presents with the following:  Patient here today with concern of RIGHT shoulder pain-history of hypertension, dyslipidemia, CAD, atypical chest pain Married, she works as a Software engineer Last seen by myself in April of this year  She has been following up with cardiology, Dr. Patsey Berthold recent follow-up in August: 1. Coronary artery disease multiple lesions detected on coronary CT angio, but none of this appears to be hemodynamically significant.  LAD proximal portion got FFR 0.92, midportion of 0.89, distal portion of 0.82.  Left circumflex have proximal portion 0.94, RCA proximal 0.92, mid 0.89, distal 0.86.  None of this is hemodynamically significant we will continue conservative approach. 2. Essential hypertension blood pressure well controlled continue present management. 3. Dyslipidemia: Last fasting lipid profile reviewed, she is on high intensity statin which I will continue.  Her LDL was 53, HDL 46, triglycerides 129.  Does have excellent cholesterol profile.  We will continue present management. 4. Atypical chest pain in a rare occasions while she is in hot tub.  I told her to let me know if this became more frequent.  Flu vaccine- done  Cologuard due next year, reminded patient Mammogram 1 year ago - she will get this done  COVID-19 complete including booster given just recently  About 1 month ago she received her COVID-19 booster, this seemed to trigger some mild dizziness and malaise.  She day after  the Covid shot she was sitting on the toilet and dry heaving, she had a vagal response and passed out.  She fell off the toilet, to the left She hit her left face on a countertop, seem to pull/ injury her right shoulder She was seen in the ER the same day- they evaluated her and let her go home   She is not sure the mechanism of injury to her right shoulder as it occurred during a syncopal episode She had immediate pain right after she fell, but the syncopal episode and facial contusion were distracting injuries In 30 days following her fall, she noted very limited right shoulder flexion.  Her arm felt weak and was painful to move She got 2 massages but it did not really help She did have a bruise on her shoulder   She feels like her shoulder is about 75% better c/w time of injury now.  However, she still has some pain especially with internal rotation.  She also has intermittent numbness that seems to run down her right arm She has had some right shoulder trouble in the past and has been doing some massage therapy  Patient Active Problem List   Diagnosis Date Noted  . Coronary artery disease 12/12/2019  . Atypical chest pain 11/12/2019  . Dyslipidemia 11/12/2019  . Essential hypertension 11/12/2019  . Borderline blood pressure 03/17/2019    Past Medical History:  Diagnosis Date  . Atypical chest pain 11/12/2019  . Borderline blood pressure 03/17/2019  . Chicken pox   . Dyslipidemia 11/12/2019  .  Essential hypertension 11/12/2019  . [place] measles   . Seasonal allergies     Past Surgical History:  Procedure Laterality Date  . AUGMENTATION MAMMAPLASTY    . MANDIBLE SURGERY    . TONSILLECTOMY      Social History   Tobacco Use  . Smoking status: Never Smoker  . Smokeless tobacco: Never Used  Substance Use Topics  . Alcohol use: Never  . Drug use: Never    Family History  Problem Relation Age of Onset  . Alzheimer's disease Father     No Known Allergies  Medication  list has been reviewed and updated.  Current Outpatient Medications on File Prior to Visit  Medication Sig Dispense Refill  . aspirin EC 81 MG tablet Take 81 mg by mouth daily.    Marland Kitchen atorvastatin (LIPITOR) 80 MG tablet Take 1 tablet (80 mg total) by mouth daily. 90 tablet 1  . Cholecalciferol (VITAMIN D) 125 MCG (5000 UT) CAPS Take 250 mcg by mouth daily.    Marland Kitchen ibuprofen (ADVIL,MOTRIN) 600 MG tablet Take 600 mg by mouth every 6 (six) hours as needed for moderate pain.     Marland Kitchen lisinopril (ZESTRIL) 10 MG tablet Take 1 tablet (10 mg total) by mouth daily. 90 tablet 3  . mometasone (NASONEX) 50 MCG/ACT nasal spray Place 1 spray into the nose daily.    . nitroGLYCERIN (NITROSTAT) 0.4 MG SL tablet Place 1 tablet (0.4 mg total) under the tongue every 5 (five) minutes as needed for chest pain. 25 tablet 3   No current facility-administered medications on file prior to visit.    Review of Systems:  As per HPI- otherwise negative.   Physical Examination: Vitals:   05/24/20 0946  BP: 128/80  Pulse: 77  Resp: 17  SpO2: 97%   Vitals:   05/24/20 0946  Weight: 169 lb (76.7 kg)  Height: 6' (1.829 m)   Body mass index is 22.92 kg/m. Ideal Body Weight: Weight in (lb) to have BMI = 25: 183.9  GEN: no acute distress.  Normal weight, looks well HEENT: Atraumatic, Normocephalic.  Bilateral TM wnl, oropharynx normal.  PEERL,EOMI.   Ears and Nose: No external deformity. CV: RRR, No M/G/R. No JVD. No thrill. No extra heart sounds. PULM: CTA B, no wheezes, crackles, rhonchi. No retractions. No resp. distress. No accessory muscle use. EXTR: No c/c/e PSYCH: Normally interactive. Conversant.  Right shoulder: Range of motion is normal except for slightly limited internal rotation on the right. She is tender over the anterior rotator cuff insertion Positive empty can testing on the right She has mild tenderness over the left cheekbone, no step-off or crepitus.  No current bruising  Assessment and  Plan: Injury of right shoulder, initial encounter - Plan: Ambulatory referral to Orthopedic Surgery  Numbness and tingling of right arm - Plan: predniSONE (DELTASONE) 20 MG tablet  Contusion of face, subsequent encounter  Patient today for follow-up from syncopal episode and fall about 1 month ago At this time, her main concern is a problem with her right shoulder.  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  No radiographic evaluation as of yet.  Offered plain films or CT scan, for the time being she declines as this seems to be improving  This visit occurred during the SARS-CoV-2 public health emergency.  Safety protocols were in place, including screening questions prior to the visit, additional usage of staff PPE, and extensive cleaning of exam room while observing appropriate contact time as indicated for disinfecting solutions.    Signed Lamar Blinks, MD

## 2020-05-18 NOTE — Patient Instructions (Addendum)
It was great to see you again today!  We will set you up with [facility] to look at your right shoulder for you We will try 10 days of steroids for likely brachial plexus pull and numbness of right arm  Please set up your mammogram!  We can plan to visit in 6 months

## 2020-05-24 ENCOUNTER — Encounter: Payer: Self-pay | Admitting: Family Medicine

## 2020-05-24 ENCOUNTER — Other Ambulatory Visit: Payer: Self-pay

## 2020-05-24 ENCOUNTER — Other Ambulatory Visit: Payer: Self-pay | Admitting: Family Medicine

## 2020-05-24 ENCOUNTER — Ambulatory Visit: Payer: 59 | Admitting: Family Medicine

## 2020-05-24 VITALS — BP 128/80 | HR 77 | Resp 17 | Ht 72.0 in | Wt 169.0 lb

## 2020-05-24 DIAGNOSIS — S0083XD Contusion of other part of head, subsequent encounter: Secondary | ICD-10-CM

## 2020-05-24 DIAGNOSIS — S4991XA Unspecified injury of right shoulder and upper arm, initial encounter: Secondary | ICD-10-CM

## 2020-05-24 DIAGNOSIS — R202 Paresthesia of skin: Secondary | ICD-10-CM

## 2020-05-24 DIAGNOSIS — R200 Anesthesia of skin: Secondary | ICD-10-CM | POA: Diagnosis not present

## 2020-05-24 MED ORDER — PREDNISONE 20 MG PO TABS: ORAL_TABLET | ORAL | 0 refills | Status: DC

## 2020-05-24 MED FILL — predniSONE 20 MG TABS: 20 | 10 days supply | Qty: 15 | Fill #0

## 2020-05-25 ENCOUNTER — Ambulatory Visit: Payer: 59 | Admitting: Orthopaedic Surgery

## 2020-05-25 ENCOUNTER — Ambulatory Visit (INDEPENDENT_AMBULATORY_CARE_PROVIDER_SITE_OTHER): Payer: 59

## 2020-05-25 ENCOUNTER — Encounter: Payer: Self-pay | Admitting: Orthopaedic Surgery

## 2020-05-25 DIAGNOSIS — M542 Cervicalgia: Secondary | ICD-10-CM

## 2020-05-25 DIAGNOSIS — M25511 Pain in right shoulder: Secondary | ICD-10-CM

## 2020-05-25 DIAGNOSIS — G8929 Other chronic pain: Secondary | ICD-10-CM | POA: Diagnosis not present

## 2020-05-25 NOTE — Progress Notes (Signed)
Office Visit Note   Patient: Patricia Rush           Date of Birth: [birth year]           MRN: [id number] Visit Date: 05/25/2020              Requested by: [name], MD [address] [place],  [place] [address] PCP: [name], MD   Assessment & Plan: Visit Diagnoses:  1. Chronic right shoulder pain   2. Neck pain     Plan: I would like to send her to outpatient physical therapy for both her cervical spine as well as her shoulder on the right side.  Any modalities per the therapist discretion.  She may benefit from cervical traction as well.  I agree with her being on the steroid taper and she should continue this until it is done.  All question concerns were answered addressed.  We can see her back in follow-up in 4 weeks from now.  Follow-Up Instructions: Return in about 4 weeks (around 06/22/2020).   Orders:  Orders Placed This Encounter  Procedures  . XR Shoulder Right  . XR Cervical Spine 2 or 3 views   No orders of the defined types were placed in this encounter.     Procedures: No procedures performed   Clinical Data: No additional findings.   Subjective: Chief Complaint  Patient presents with  . Right Shoulder - Pain  . Right Arm - Numbness  The patient is a very pleasant right-hand-dominant 64 year old female who sustained a hard mechanical fall on October 4 a day after having her code booster shot.  She has become lightheaded and fell hard.  Since then she has had right shoulder pain and some weakness with decreased range of motion.  She is also had some numbness and tingling going down into her right dominant hand that is been on the lateral aspect of her hand.  She said the numbness really started about 2 weeks ago.  She was just started on 10 days of a steroid yesterday.  She is not a diabetic.  She is never injured her shoulder before.  She has had a history of shoulder pain for couple years now.  She is also had some  trapezius type of pain.  She is otherwise a very active individual and young appearing.  She denies any other acute change in medical status.  HPI  Review of Systems She currently denies any headache, chest pain, shortness of breath, fever, chills, nausea, vomiting  Objective: Vital Signs: There were no vitals taken for this visit.  Physical Exam She is alert and orient x3 and in no acute distress Ortho Exam Examination of her right shoulder shows full and fluid range of motion of her shoulder but it is painful.  Her liftoff is negative and she does show integrity and strength of the rotator cuff.  The shoulder is clinically well located.  There is no pain over the [facility] joint.  She does have positive Spurling sign to the right side.  She has 5 out of 5 strength of all of her muscles in the right lower extremity except for just some slight weakness in the triceps.  There is subjective numbness in the lateral aspect of her hand.  There is a negative Tinel's sign at the cubital tunnel or the carpal tunnel.  She has good grip and pinch strength in her hand is well-perfused. Specialty Comments:  No  specialty comments available.  Imaging: XR Cervical Spine 2 or 3 views  Result Date: 05/25/2020 2 views of the cervical spine degenerative disc disease between C4 and C5 as well as C5 and C6 the overall alignment is well-maintained.  XR Shoulder Right  Result Date: 05/25/2020 3 views of the right shoulder show well located shoulder with no acute findings.    PMFS History: Patient Active Problem List   Diagnosis Date Noted  . Coronary artery disease 12/12/2019  . Atypical chest pain 11/12/2019  . Dyslipidemia 11/12/2019  . Essential hypertension 11/12/2019  . Borderline blood pressure 03/17/2019   Past Medical History:  Diagnosis Date  . Atypical chest pain 11/12/2019  . Borderline blood pressure 03/17/2019  . Chicken pox   . Dyslipidemia 11/12/2019  . Essential hypertension 11/12/2019  .  [place] measles   . Seasonal allergies     Family History  Problem Relation Age of Onset  . Alzheimer's disease Father     Past Surgical History:  Procedure Laterality Date  . AUGMENTATION MAMMAPLASTY    . MANDIBLE SURGERY    . TONSILLECTOMY     Social History   Occupational History  . Not on file  Tobacco Use  . Smoking status: Never Smoker  . Smokeless tobacco: Never Used  Substance and Sexual Activity  . Alcohol use: Never  . Drug use: Never  . Sexual activity: Yes    Birth control/protection: None

## 2020-05-26 ENCOUNTER — Other Ambulatory Visit: Payer: Self-pay

## 2020-05-26 DIAGNOSIS — M25511 Pain in right shoulder: Secondary | ICD-10-CM

## 2020-05-26 DIAGNOSIS — G8929 Other chronic pain: Secondary | ICD-10-CM

## 2020-05-26 DIAGNOSIS — M542 Cervicalgia: Secondary | ICD-10-CM

## 2020-06-03 ENCOUNTER — Ambulatory Visit: Payer: 59 | Attending: Orthopaedic Surgery | Admitting: Physical Therapy

## 2020-06-03 ENCOUNTER — Other Ambulatory Visit: Payer: Self-pay

## 2020-06-03 ENCOUNTER — Encounter: Payer: Self-pay | Admitting: Physical Therapy

## 2020-06-03 DIAGNOSIS — M25611 Stiffness of right shoulder, not elsewhere classified: Secondary | ICD-10-CM | POA: Diagnosis not present

## 2020-06-03 DIAGNOSIS — R293 Abnormal posture: Secondary | ICD-10-CM | POA: Insufficient documentation

## 2020-06-03 DIAGNOSIS — M6281 Muscle weakness (generalized): Secondary | ICD-10-CM | POA: Insufficient documentation

## 2020-06-03 DIAGNOSIS — M5412 Radiculopathy, cervical region: Secondary | ICD-10-CM | POA: Diagnosis not present

## 2020-06-03 DIAGNOSIS — M542 Cervicalgia: Secondary | ICD-10-CM | POA: Insufficient documentation

## 2020-06-03 DIAGNOSIS — M25511 Pain in right shoulder: Secondary | ICD-10-CM | POA: Insufficient documentation

## 2020-06-03 NOTE — Therapy (Signed)
[facility] [address]  [place], [place], [address] Phone: [phone]   Fax:  [phone]  Physical Therapy Evaluation  Patient Details  Name: Patricia Rush MRN: [id number] Date of Birth: 1956-02-03 Referring Provider (PT): Jean Rosenthal, MD   Encounter Date: 06/03/2020   PT End of Session - 06/03/20 1617    Visit Number 1    Number of Visits 12    Date for PT Re-Evaluation 07/15/20    Authorization Type [facility]    PT Start Time 5809    PT Stop Time 9833    PT Time Calculation (min) 56 min    Activity Tolerance Patient tolerated treatment well    Behavior During Therapy [facility] for tasks assessed/performed           Past Medical History:  Diagnosis Date   Atypical chest pain 11/12/2019   Borderline blood pressure 03/17/2019   Chicken pox    Dyslipidemia 11/12/2019   Essential hypertension 11/12/2019   [place] measles    Seasonal allergies     Past Surgical History:  Procedure Laterality Date   AUGMENTATION MAMMAPLASTY     MANDIBLE SURGERY     TONSILLECTOMY      There were no vitals filed for this visit.    Subjective Assessment - 06/03/20 1621    Subjective Pt reports about 3 yrs ago she started having trouble raising her R arm overhead. Got some massage and worked on some stretches at the time. Then after latest COVID-19 booster she passed out and fell. Since fall she has again had limited ROM in R shoulder as well as extreme tightness in B UT. Was put on steriod does pack with good relief. Also notes temporary benefit from TENS.    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 The overall alignment is well-maintained. Shoulder unremarkable.    Patient Stated Goals "to know what exercises and/or activities to work on to help my shoulder"    Currently in Pain? Yes    Pain Score 2    up to 4/10 with  extremes of motion   Pain Location Shoulder   & B UT   Pain Orientation Right    Pain Descriptors / Indicators Discomfort    Pain Type Acute pain    Pain Radiating Towards initially intermittent pain into R bicep and radicular numbness & tingling into R 4th and 5th digit    Pain Onset More than a month ago   04/26/20   Pain Frequency Intermittent    Aggravating Factors  using R arm - working with mouse on computer, reaching overhead; fatigue    Pain Relieving Factors steroids, TENS    Effect of Pain on Daily Activities unable to lift; limited ability to work-out at gym              [facility] PT Assessment - 06/03/20 1617      Assessment   Medical Diagnosis Chronic R shoulder pain, neck pain    Referring Provider (PT) Jean Rosenthal, MD    Onset Date/Surgical Date 04/26/20    Hand Dominance Right    Next MD Visit 06/23/20    Prior Therapy none      Precautions   Precautions None      Restrictions   Weight Bearing Restrictions No      Balance Screen   Has the patient fallen in  the past 6 months Yes    How many times? 2    Has the patient had a decrease in activity level because of a fear of falling?  No    Is the patient reluctant to leave their home because of a fear of falling?  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, killing plants, gym 2x/wk      Cognition   Overall Cognitive Status Within Functional Limits for tasks assessed      Observation/Other Assessments   Focus on Therapeutic Outcomes (FOTO)  Shoulder - 54% (46% limitation); Predicted 68% (32% limitation)      Posture/Postural Control   Posture/Postural Control Postural limitations    Postural Limitations Forward head;Rounded Shoulders;Increased thoracic kyphosis      ROM / Strength   AROM / PROM / Strength  AROM;Strength      AROM   AROM Assessment Site Cervical;Shoulder    Right/Left Shoulder Right;Left    Right Shoulder Flexion 137 Degrees    Right Shoulder ABduction 115 Degrees    Right Shoulder Internal Rotation --   FER to T11   Right Shoulder External Rotation --   FER to C7   Left Shoulder Flexion 145 Degrees    Left Shoulder ABduction 123 Degrees    Left Shoulder Internal Rotation --   FIR to T9   Left Shoulder External Rotation --   FER to C7   Cervical Flexion 61    Cervical Extension 48    Cervical - Right Side Bend 24    Cervical - Left Side Bend 22    Cervical - Right Rotation 45    Cervical - Left Rotation 46      Strength   Overall Strength Comments some pain wiht resisted MMT on R    Strength Assessment Site Shoulder    Right/Left Shoulder Right;Left    Right Shoulder Flexion 4-/5    Right Shoulder ABduction 4-/5    Right Shoulder Internal Rotation 4+/5    Right Shoulder External Rotation 4/5    Left Shoulder Flexion 4/5    Left Shoulder ABduction 4/5    Left Shoulder Internal Rotation 4+/5    Left Shoulder External Rotation 4+/5      Palpation   Palpation comment increased muscle tension and ttp in R>L UT, LS, rhomboids, cervicothoracic paraspinals, R deltoid & pec minor                      Objective measurements completed on examination: See above findings.       [facility] Adult PT Treatment/Exercise - 06/03/20 1617      Exercises   Exercises Shoulder      Shoulder Exercises: Supine   Other Supine Exercises Thoracic extension mobs over FR 10 x 3"      Shoulder Exercises: Seated   Retraction 10 reps;AROM;Strengthening    Retraction Limitations + depression & long arm ER    Other Seated Exercises Thoracic extension mobs over back of chair 5 x 5"      Shoulder Exercises: Standing   Extension Both;10 reps;Strengthening;Theraband    Theraband Level (Shoulder Extension) Level 2 (Red)    Extension Limitations cues for scap retaction &  depression    Row Both;10 reps;Strengthening;Theraband  Theraband Level (Shoulder Row) Level 2 (Red)    Row Limitations cues for scap retaction & depression, pulling elbows into ribs while maintaining forearms parallelt to floor & avoiding upward rotation of elbows      Shoulder Exercises: IT sales professional 30 seconds;3 reps    Corner Stretch Limitations 3-way doorway pec stretch - cues to step through rather than lean fwd in doorway      Neck Exercises: Stretches   Upper Trapezius Stretch Right;Left;30 seconds;1 rep    Levator Stretch Right;Left;30 seconds;1 rep                  PT Education - 06/03/20 1710    Education Details PT eval findings, anticipated POC & initial HEP    Person(s) Educated Patient    Methods Explanation;Demonstration;Verbal cues;Handout    Comprehension Verbalized understanding;Verbal cues required;Returned demonstration;Need further instruction            PT Short Term Goals - 06/03/20 1713      PT SHORT TERM GOAL #1   Title Patient will be independent with initial HEP    Status New    Target Date 06/24/20      PT SHORT TERM GOAL #2   Title Patient will verbalize/demonstrate good awareness of neutral spine and shoulder posture and proper body mechanics for daily tasks    Status New    Target Date 06/24/20             PT Long Term Goals - 06/03/20 1713      PT LONG TERM GOAL #1   Title Patient will be independent with ongoing/advanced HEP    Status New    Target Date 07/15/20      PT LONG TERM GOAL #2   Title Patient to demonstrate improved tissue quality and pliability as noted by reduced tissue tightness and tenderness to palpation    Status New    Target Date 07/15/20      PT LONG TERM GOAL #3   Title Patient to improve neck & R shoulder AROM to WFL/WNL without pain provocation    Status New    Target Date 07/15/20      PT LONG TERM GOAL #4   Title Patient will demonstrate improved B strength to >/= 4+/5 for  functional UE use    Status New    Target Date 07/15/20      PT LONG TERM GOAL #5   Title Patient to report ability to perform ADLs, household, and work-related tasks without limitation due to R shoulder pain, LOM or weakness    Status New    Target Date 07/15/20                  Plan - 06/03/20 1713    Clinical Impression Statement Patricia Rush is a 64 y/o female who presents to OP PT with ~5 week h/o acute on chronic R shoulder pain as well as neck pain secondary to trauma from syncopal fall. Recent x-rays reveal cervical spine degenerative disc disease between C4 and C5 as well as C5 and C6 with the overall alignment well-maintained, shoulder x-rays unremarkable. Patient reports improvement in pain and motion since steroids. Deficits include forward head and rounded shoulder posture; ttp throughout R>L UT/LS and rhomboids, R deltoids and lateral pecs; mildly limited AROM B shoulders R>L; and mild R shoulder weakness. Patricia Rush will benefit from skilled PT to address above deficits, improve posture and restore functional ROM and strength to allow her  to resume normal daily activities without pain interference.    Personal Factors and Comorbidities Time since onset of injury/illness/exacerbation;Comorbidity 2;Past/Current Experience;Fitness    Comorbidities HTN, augmentation mammaplasty    Examination-Activity Limitations Caring for Others;Lift;Carry;Reach Overhead    Examination-Participation Restrictions Cleaning;Community Activity;Driving;Laundry;Meal Prep;Occupation    Stability/Clinical Decision Making Stable/Uncomplicated    Clinical Decision Making Low    Rehab Potential Good    PT Frequency 2x / week    PT Duration 6 weeks    PT Treatment/Interventions ADLs/Self Care Home Management;Cryotherapy;Electrical Stimulation;Iontophoresis 4mg /ml Dexamethasone;Moist Heat;Traction;Ultrasound;Functional mobility training;Therapeutic activities;Therapeutic exercise;Neuromuscular  re-education;Patient/family education;Manual techniques;Passive range of motion;Dry needling;Taping;Vasopneumatic Device;Joint Manipulations;Spinal Manipulations    PT Next Visit Plan Review initial HEP; manual therapy + potential DN to address increased muscle tension; postural education and strengthening; modalities PRN    PT Home Exercise Plan 11/11 - UT, LS & doorway pec/deltoid strettches, thoracic extension mobs, scap retraction, red TB rows/retraction    Consulted and Agree with Plan of Care Patient           Patient will benefit from skilled therapeutic intervention in order to improve the following deficits and impairments:  Decreased activity tolerance, Decreased range of motion, Decreased strength, Hypomobility, Increased fascial restricitons, Increased muscle spasms, Impaired perceived functional ability, Impaired flexibility, Impaired UE functional use, Improper body mechanics, Postural dysfunction, Pain  Visit Diagnosis: Acute pain of right shoulder  Stiffness of right shoulder, not elsewhere classified  Muscle weakness (generalized)  Abnormal posture  Cervicalgia  Radiculopathy, cervical region     Problem List Patient Active Problem List   Diagnosis Date Noted   Coronary artery disease 12/12/2019   Atypical chest pain 11/12/2019   Dyslipidemia 11/12/2019   Essential hypertension 11/12/2019   Borderline blood pressure 03/17/2019    Percival Spanish, PT, MPT 06/03/2020, 6:23 PM  [facility] [address]  [place], [place], [address] Phone: [phone]   Fax:  [phone]  Name: Patricia Rush MRN: [id number] Date of Birth: 1956-03-24

## 2020-06-03 NOTE — Patient Instructions (Signed)
    Home exercise program created by Dacari Beckstrand, PT.  For questions, please contact Dovid Bartko via phone at [phone] or email at [email or web address]  [facility] [address]  [address] [place], [place], [address] Phone: [phone]   Fax:  [phone]    

## 2020-06-09 ENCOUNTER — Ambulatory Visit: Payer: 59

## 2020-06-09 ENCOUNTER — Other Ambulatory Visit: Payer: Self-pay

## 2020-06-09 DIAGNOSIS — M25611 Stiffness of right shoulder, not elsewhere classified: Secondary | ICD-10-CM

## 2020-06-09 DIAGNOSIS — M542 Cervicalgia: Secondary | ICD-10-CM | POA: Diagnosis not present

## 2020-06-09 DIAGNOSIS — M25511 Pain in right shoulder: Secondary | ICD-10-CM | POA: Diagnosis not present

## 2020-06-09 DIAGNOSIS — R293 Abnormal posture: Secondary | ICD-10-CM

## 2020-06-09 DIAGNOSIS — M6281 Muscle weakness (generalized): Secondary | ICD-10-CM

## 2020-06-09 DIAGNOSIS — M5412 Radiculopathy, cervical region: Secondary | ICD-10-CM | POA: Diagnosis not present

## 2020-06-09 NOTE — Patient Instructions (Signed)

## 2020-06-09 NOTE — Therapy (Signed)
[facility] [address]  [place], [place], [address] Phone: [phone]   Fax:  [phone]  Physical Therapy Treatment  Patient Details  Name: Patricia Rush MRN: [id number] Date of Birth: 23-Jul-1956 Referring Provider (PT): Jean Rosenthal, MD   Encounter Date: 06/09/2020   PT End of Session - 06/09/20 1656    Visit Number 2    Number of Visits 12    Date for PT Re-Evaluation 07/15/20    Authorization Type [facility]    PT Start Time 1648    PT Stop Time 1737    PT Time Calculation (min) 49 min    Activity Tolerance Patient tolerated treatment well    Behavior During Therapy [facility] for tasks assessed/performed           Past Medical History:  Diagnosis Date  . Atypical chest pain 11/12/2019  . Borderline blood pressure 03/17/2019  . Chicken pox   . Dyslipidemia 11/12/2019  . Essential hypertension 11/12/2019  . [place] measles   . Seasonal allergies     Past Surgical History:  Procedure Laterality Date  . AUGMENTATION MAMMAPLASTY    . MANDIBLE SURGERY    . TONSILLECTOMY      There were no vitals filed for this visit.   Subjective Assessment - 06/09/20 1655    Subjective Has been performing HEP.    Currently in Pain? Yes    Pain Score 4     Pain Location Shoulder    Pain Orientation Right;Lateral    Pain Descriptors / Indicators Discomfort    Pain Type Acute pain    Pain Radiating Towards denies today    Pain Onset More than a month ago    Pain Frequency Intermittent    Multiple Pain Sites No                             [facility] Adult PT Treatment/Exercise - 06/09/20 0001      Shoulder Exercises: Supine   Horizontal ABduction 10 reps;Both;Strengthening;Theraband    Theraband Level (Shoulder Horizontal ABduction) Level 2 (Red)    Horizontal ABduction Limitations Hooklying laying on 6"  foam     External Rotation Both;10 reps;Theraband;Strengthening    Theraband Level  (Shoulder External Rotation) Level 2 (Red)    External Rotation Limitations Hooklying on 6" foam bolster       Shoulder Exercises: Standing   Extension Both;10 reps;Strengthening;Theraband    Theraband Level (Shoulder Extension) Level 2 (Red)    Extension Limitations cues for scap retaction & depression    Row Both;10 reps;Strengthening;Theraband    Theraband Level (Shoulder Row) Level 2 (Red)      Shoulder Exercises: ROM/Strengthening   UBE (Upper Arm Bike) Lvl 1.5, 3 min forwards, 3 min backwards     Cybex Row 10 reps    Cybex Row Limitations low and mid 10#       Shoulder Exercises: IT sales professional 30 seconds;3 reps    Corner Stretch Limitations 3-way doorway pec stretch - cues to step through rather than lean fwd in doorway      Neck Exercises: Stretches   Upper Trapezius Stretch Right;Left;30 seconds;1 rep    Levator Stretch Right;Left;30 seconds;1 rep    Corner Stretch 3 reps;20 seconds    Corner Stretch Limitations 3-way doorway stretch                   PT  Education - 06/09/20 1758    Education Details DN edu handout    Person(s) Educated Patient    Methods Explanation;Demonstration;Verbal cues;Handout    Comprehension Verbalized understanding;Returned demonstration;Verbal cues required            PT Short Term Goals - 06/09/20 1658      PT SHORT TERM GOAL #1   Title Patient will be independent with initial HEP    Status On-going    Target Date 06/24/20      PT SHORT TERM GOAL #2   Title Patient will verbalize/demonstrate good awareness of neutral spine and shoulder posture and proper body mechanics for daily tasks    Status On-going    Target Date 06/24/20             PT Long Term Goals - 06/09/20 1714      PT LONG TERM GOAL #1   Title Patient will be independent with ongoing/advanced HEP    Status On-going      PT LONG TERM GOAL #2   Title Patient to demonstrate improved tissue quality and pliability as noted by reduced tissue  tightness and tenderness to palpation    Status On-going      PT LONG TERM GOAL #3   Title Patient to improve neck & R shoulder AROM to WFL/WNL without pain provocation    Status On-going      PT LONG TERM GOAL #4   Title Patient will demonstrate improved B strength to >/= 4+/5 for functional UE use    Status On-going      PT LONG TERM GOAL #5   Title Patient to report ability to perform ADLs, household, and work-related tasks without limitation due to R shoulder pain, LOM or weakness    Status On-going                 Plan - 06/09/20 1701    Clinical Impression Statement Jacaria doing ok.  Notes good tolerance for HEP.  Able to demo mostly good technique with HEP review however minor positioning adjustments applied to doorway stretch and UT stretch to enhance intended stretch sensation.  Progressed postural/scapular strengthening and anterior chest stretching without pain.  Ann-Marie does note significant improvement in shoulder since Prednisone a few weeks ago.    Comorbidities HTN, augmentation mammaplasty    Rehab Potential Good    PT Frequency 2x / week    PT Duration 6 weeks    PT Treatment/Interventions ADLs/Self Care Home Management;Cryotherapy;Electrical Stimulation;Iontophoresis 4mg /ml Dexamethasone;Moist Heat;Traction;Ultrasound;Functional mobility training;Therapeutic activities;Therapeutic exercise;Neuromuscular re-education;Patient/family education;Manual techniques;Passive range of motion;Dry needling;Taping;Vasopneumatic Device;Joint Manipulations;Spinal Manipulations    PT Next Visit Plan Manual therapy + potential DN to address increased muscle tension; postural education and strengthening; modalities PRN    PT Home Exercise Plan 11/11 - UT, LS & doorway pec/deltoid strettches, thoracic extension mobs, scap retraction, red TB rows/retraction    Consulted and Agree with Plan of Care Patient           Patient will benefit from skilled therapeutic intervention in order  to improve the following deficits and impairments:  Decreased activity tolerance, Decreased range of motion, Decreased strength, Hypomobility, Increased fascial restricitons, Increased muscle spasms, Impaired perceived functional ability, Impaired flexibility, Impaired UE functional use, Improper body mechanics, Postural dysfunction, Pain  Visit Diagnosis: Acute pain of right shoulder  Stiffness of right shoulder, not elsewhere classified  Muscle weakness (generalized)  Abnormal posture  Cervicalgia  Radiculopathy, cervical region     Problem List Patient Active  Problem List   Diagnosis Date Noted  . Coronary artery disease 12/12/2019  . Atypical chest pain 11/12/2019  . Dyslipidemia 11/12/2019  . Essential hypertension 11/12/2019  . Borderline blood pressure 03/17/2019    Bess Harvest, PTA 06/09/20 5:58 PM   [facility] [address]  [place], [place], [address] Phone: [phone]   Fax:  [phone]  Name: CHIQUETTA LANGNER MRN: [id number] Date of Birth: 02/05/1956

## 2020-06-15 ENCOUNTER — Ambulatory Visit: Payer: 59 | Admitting: Physical Therapy

## 2020-06-15 ENCOUNTER — Encounter: Payer: Self-pay | Admitting: Physical Therapy

## 2020-06-15 ENCOUNTER — Other Ambulatory Visit: Payer: Self-pay

## 2020-06-15 DIAGNOSIS — M5412 Radiculopathy, cervical region: Secondary | ICD-10-CM | POA: Diagnosis not present

## 2020-06-15 DIAGNOSIS — M25511 Pain in right shoulder: Secondary | ICD-10-CM

## 2020-06-15 DIAGNOSIS — M25611 Stiffness of right shoulder, not elsewhere classified: Secondary | ICD-10-CM

## 2020-06-15 DIAGNOSIS — M6281 Muscle weakness (generalized): Secondary | ICD-10-CM

## 2020-06-15 DIAGNOSIS — R293 Abnormal posture: Secondary | ICD-10-CM

## 2020-06-15 DIAGNOSIS — M542 Cervicalgia: Secondary | ICD-10-CM | POA: Diagnosis not present

## 2020-06-15 NOTE — Therapy (Signed)
[facility] [address]  [place], [place], [address] Phone: [phone]   Fax:  [phone]  Physical Therapy Treatment  Patient Details  Name: Patricia Rush MRN: [id number] Date of Birth: 11-13-1955 Referring Provider (PT): Patricia Rosenthal, MD   Encounter Date: 06/15/2020   PT End of Session - 06/15/20 0804    Visit Number 3    Number of Visits 12    Date for PT Re-Evaluation 07/15/20    PT Start Time 0804    PT Stop Time 0846    PT Time Calculation (min) 42 min    Activity Tolerance Patient tolerated treatment well    Behavior During Therapy [facility] for tasks assessed/performed           Past Medical History:  Diagnosis Date  . Atypical chest pain 11/12/2019  . Borderline blood pressure 03/17/2019  . Chicken pox   . Dyslipidemia 11/12/2019  . Essential hypertension 11/12/2019  . [place] measles   . Seasonal allergies     Past Surgical History:  Procedure Laterality Date  . AUGMENTATION MAMMAPLASTY    . MANDIBLE SURGERY    . TONSILLECTOMY      There were no vitals filed for this visit.   Subjective Assessment - 06/15/20 0809    Subjective Pt reports her shoulders were kinda achy yesterday after working two 12 hr shifts over the weekend so she took it easy, but denies pain today. Notes she had a massage last Thurs where the MT worked [employer].    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 The overall alignment is well-maintained. Shoulder unremarkable.    Patient Stated Goals "to know what exercises and/or activities to work on to help my shoulder"    Currently in Pain? No/denies                             [facility] Adult PT Treatment/Exercise - 06/15/20 0804      Exercises   Exercises Shoulder      Shoulder Exercises: ROM/Strengthening   Nustep L4 x 6 min (UE/LE)      Manual Therapy    Manual Therapy Soft tissue mobilization;Myofascial release    Manual therapy comments skilled palpation and monitoring during DN    Soft tissue mobilization STM/DTM to L anterolateral deltoid, pecs, B UT & LS - multiple taut bands and TPs identified    Myofascial Release manual TPR to L anterolateral deltoid, pecs, B UT & LS - decreased muscle tension and improved ROM following MT & DN            Trigger Point Dry Needling - 06/15/20 0804    Consent Given? Yes    Education Handout Provided Previously provided    Muscles Treated Head and Neck Upper trapezius;Levator scapulae   Bilateral   Muscles Treated Upper Quadrant Deltoid   Lt   Upper Trapezius Response Twitch reponse elicited;Palpable increased muscle length    Levator Scapulae Response Twitch response elicited;Palpable increased muscle length    Deltoid Response Twitch response elicited;Palpable increased muscle length   L anterior & lateral               PT Education - 06/15/20 0814    Education Details DN rational, procedures, outcomes, potential side effects, and recommended post-treatment exercises/activity    Person(s) Educated Patient  Methods Explanation;Handout   handout previously provided   Comprehension Verbalized understanding            PT Short Term Goals - 06/09/20 1658      PT SHORT TERM GOAL #1   Title Patient will be independent with initial HEP    Status On-going    Target Date 06/24/20      PT SHORT TERM GOAL #2   Title Patient will verbalize/demonstrate good awareness of neutral spine and shoulder posture and proper body mechanics for daily tasks    Status On-going    Target Date 06/24/20             PT Long Term Goals - 06/15/20 0812      PT LONG TERM GOAL #1   Title Patient will be independent with ongoing/advanced HEP    Status On-going    Target Date 07/15/20      PT LONG TERM GOAL #2   Title Patient to demonstrate improved tissue quality and pliability as noted by reduced  tissue tightness and tenderness to palpation    Status On-going    Target Date 07/15/20      PT LONG TERM GOAL #3   Title Patient to improve neck & R shoulder AROM to WFL/WNL without pain provocation    Status On-going    Target Date 07/15/20      PT LONG TERM GOAL #4   Title Patient will demonstrate improved B strength to >/= 4+/5 for functional UE use    Status On-going    Target Date 07/15/20      PT LONG TERM GOAL #5   Title Patient to report ability to perform ADLs, household, and work-related tasks without limitation due to R shoulder pain, LOM or weakness    Status On-going    Target Date 07/15/20                 Plan - 06/15/20 0846    Clinical Impression Statement Patricia Rush notes continued increased shoulder soreness especially with fatigue after working long shifts over the weekend. Primary area of concern today is in L anterior deltoid, but she also notes chronic increased muscle tension in B UT and LS and expresses interest in trying DN. After explanation of DN rational, procedures, outcomes and potential side effects, patient verbalized consent to DN treatment in conjunction with manual STM/DTM and TPR to reduce ttp/muscle tension. Muscles treated include L anterolateral deltoid, B UT & LS. DN produced normal response with good twitches elicited resulting in palpable reduction in pain/ttp and muscle tension with pt noting significant improvement with less pain into L shoulder horizontal abduction & extension. Pt educated to expect mild to moderate muscle soreness for up to 24-48 hrs and instructed to continue prescribed home exercise program and current activity level with pt verbalizing understanding of theses instructions. Pt may benefit from DN to cervicothoracic paraspinals +/- suboccipitals in subsequent vists.    Comorbidities HTN, augmentation mammaplasty    Rehab Potential Good    PT Frequency 2x / week    PT Duration 6 weeks    PT Treatment/Interventions  ADLs/Self Care Home Management;Cryotherapy;Electrical Stimulation;Iontophoresis 4mg /ml Dexamethasone;Moist Heat;Traction;Ultrasound;Functional mobility training;Therapeutic activities;Therapeutic exercise;Neuromuscular re-education;Patient/family education;Manual techniques;Passive range of motion;Dry needling;Taping;Vasopneumatic Device;Joint Manipulations;Spinal Manipulations    PT Next Visit Plan assess response to DN; postural education and strengthening; manual therapy + DN as indicated to address increased muscle tension; modalities PRN    PT Home Exercise Plan 11/11 - UT, LS & doorway  pec/deltoid strettches, thoracic extension mobs, scap retraction, red TB rows/retraction    Consulted and Agree with Plan of Care Patient           Patient will benefit from skilled therapeutic intervention in order to improve the following deficits and impairments:  Decreased activity tolerance, Decreased range of motion, Decreased strength, Hypomobility, Increased fascial restricitons, Increased muscle spasms, Impaired perceived functional ability, Impaired flexibility, Impaired UE functional use, Improper body mechanics, Postural dysfunction, Pain  Visit Diagnosis: Acute pain of right shoulder  Stiffness of right shoulder, not elsewhere classified  Muscle weakness (generalized)  Abnormal posture     Problem List Patient Active Problem List   Diagnosis Date Noted  . Coronary artery disease 12/12/2019  . Atypical chest pain 11/12/2019  . Dyslipidemia 11/12/2019  . Essential hypertension 11/12/2019  . Borderline blood pressure 03/17/2019    Percival Spanish, PT, MPT 06/15/2020, 12:53 PM  [facility] [address]  [place], [place], [address] Phone: [phone]   Fax:  [phone]  Name: Patricia Rush MRN: [id number] Date of Birth: 1956-05-05

## 2020-06-21 ENCOUNTER — Other Ambulatory Visit: Payer: Self-pay

## 2020-06-21 ENCOUNTER — Ambulatory Visit: Payer: 59

## 2020-06-21 DIAGNOSIS — M6281 Muscle weakness (generalized): Secondary | ICD-10-CM

## 2020-06-21 DIAGNOSIS — R293 Abnormal posture: Secondary | ICD-10-CM | POA: Diagnosis not present

## 2020-06-21 DIAGNOSIS — M25511 Pain in right shoulder: Secondary | ICD-10-CM | POA: Diagnosis not present

## 2020-06-21 DIAGNOSIS — M25611 Stiffness of right shoulder, not elsewhere classified: Secondary | ICD-10-CM

## 2020-06-21 DIAGNOSIS — M542 Cervicalgia: Secondary | ICD-10-CM | POA: Diagnosis not present

## 2020-06-21 DIAGNOSIS — M5412 Radiculopathy, cervical region: Secondary | ICD-10-CM | POA: Diagnosis not present

## 2020-06-21 NOTE — Therapy (Signed)
[facility] [address]  [place], [place], [address] Phone: [phone]   Fax:  [phone]  Physical Therapy Treatment  Patient Details  Name: Patricia Rush MRN: [id number] Date of Birth: 06-13-1956 Referring Provider (PT): Jean Rosenthal, MD   Encounter Date: 06/21/2020   PT End of Session - 06/21/20 1723    Visit Number 4    Number of Visits 12    Date for PT Re-Evaluation 07/15/20    PT Start Time 4081    PT Stop Time 1740    PT Time Calculation (min) 42 min    Activity Tolerance Patient tolerated treatment well    Behavior During Therapy [facility] for tasks assessed/performed           Past Medical History:  Diagnosis Date  . Atypical chest pain 11/12/2019  . Borderline blood pressure 03/17/2019  . Chicken pox   . Dyslipidemia 11/12/2019  . Essential hypertension 11/12/2019  . [place] measles   . Seasonal allergies     Past Surgical History:  Procedure Laterality Date  . AUGMENTATION MAMMAPLASTY    . MANDIBLE SURGERY    . TONSILLECTOMY      There were no vitals filed for this visit.   Subjective Assessment - 06/21/20 1703    Subjective Pt. noting good benefit from DN.    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 The overall alignment is well-maintained. Shoulder unremarkable.    Patient Stated Goals "to know what exercises and/or activities to work on to help my shoulder"    Currently in Pain? Yes    Pain Score 4     Pain Location Shoulder    Pain Orientation Right;Anterior;Lateral    Pain Descriptors / Indicators Dull    Pain Type Acute pain    Multiple Pain Sites No              [facility] PT Assessment - 06/21/20 0001      AROM   AROM Assessment Site Cervical    Cervical Flexion 55    Cervical Extension 52    Cervical - Right Side Bend 32    Cervical - Left Side Bend 37    Cervical - Right  Rotation 52    Cervical - Left Rotation 64                         [facility] Adult PT Treatment/Exercise - 06/21/20 0001      Shoulder Exercises: Supine   Horizontal ABduction 10 reps;Both;Strengthening;Theraband    Theraband Level (Shoulder Horizontal ABduction) Level 3 ([place])    Horizontal ABduction Limitations Hooklying laying on 6"  foam     External Rotation Both;10 reps;Theraband;Strengthening    Theraband Level (Shoulder External Rotation) Level 3 ([place])    External Rotation Limitations Hooklying on 6" foam bolster     Flexion Right;Left;10 reps;Theraband    Theraband Level (Shoulder Flexion) Level 3 ([place])    Flexion Limitations flex/ext. (X) Hooklying on 6" FR      Shoulder Exercises: Standing   Flexion Both;10 reps;Weights;Strengthening   fatigue pain free    Shoulder Flexion Weight (lbs) 2    ABduction Both;10 reps;Strengthening;Weights   fatigue pain free    Shoulder ABduction Weight (lbs) 2      Shoulder Exercises: ROM/Strengthening   UBE (Upper Arm Bike) Lvl 3.0, 3 min forwards,  3 min backwards     Cybex Row 15 reps   2 sets    Cybex Row Limitations low and mid 20#     Other ROM/Strengthening Exercises B cable row 20# x 10      Neck Exercises: Stretches   Corner Stretch 3 reps;20 seconds    Corner Stretch Limitations 3-way doorway stretch                     PT Short Term Goals - 06/21/20 1729      PT SHORT TERM GOAL #1   Title Patient will be independent with initial HEP    Status Achieved    Target Date 06/24/20      PT SHORT TERM GOAL #2   Title Patient will verbalize/demonstrate good awareness of neutral spine and shoulder posture and proper body mechanics for daily tasks    Status Achieved    Target Date 06/24/20             PT Long Term Goals - 06/21/20 1754      PT LONG TERM GOAL #1   Title Patient will be independent with ongoing/advanced HEP    Status On-going      PT LONG TERM GOAL #2   Title Patient to  demonstrate improved tissue quality and pliability as noted by reduced tissue tightness and tenderness to palpation    Status On-going      PT LONG TERM GOAL #3   Title Patient to improve neck & R shoulder AROM to WFL/WNL without pain provocation    Status Partially Met   06/21/20:  Progressing with neck AROM     PT LONG TERM GOAL #4   Title Patient will demonstrate improved B strength to >/= 4+/5 for functional UE use    Status On-going      PT LONG TERM GOAL #5   Title Patient to report ability to perform ADLs, household, and work-related tasks without limitation due to R shoulder pain, LOM or weakness    Status On-going                 Plan - 06/21/20 1740    Clinical Impression Statement Pt. reporting no questions regarding HEP.  STG #1 met.  Pt. noting improved awareness of posture and body mechanics with work-related activities.  STG #2 met.  Progressing toward LTG #3 demonstrating improved cervical ROM in all planes.  Pt. noting good benefit from DN and given pain improvement and improved cervical ROM wishes to have DN to R anterior shoulder in coming session.    Comorbidities HTN, augmentation mammaplasty    Rehab Potential Good    PT Frequency 2x / week    PT Duration 6 weeks    PT Treatment/Interventions ADLs/Self Care Home Management;Cryotherapy;Electrical Stimulation;Iontophoresis 43m/ml Dexamethasone;Moist Heat;Traction;Ultrasound;Functional mobility training;Therapeutic activities;Therapeutic exercise;Neuromuscular re-education;Patient/family education;Manual techniques;Passive range of motion;Dry needling;Taping;Vasopneumatic Device;Joint Manipulations;Spinal Manipulations    PT Next Visit Plan Postural education and strengthening; manual therapy + DN as indicated to address increased muscle tension; modalities PRN    PT Home Exercise Plan 11/11 - UT, LS & doorway pec/deltoid strettches, thoracic extension mobs, scap retraction, red TB rows/retraction    Consulted and  Agree with Plan of Care Patient           Patient will benefit from skilled therapeutic intervention in order to improve the following deficits and impairments:  Decreased activity tolerance, Decreased range of motion, Decreased strength, Hypomobility, Increased fascial restricitons, Increased muscle spasms,  Impaired perceived functional ability, Impaired flexibility, Impaired UE functional use, Improper body mechanics, Postural dysfunction, Pain  Visit Diagnosis: Acute pain of right shoulder  Stiffness of right shoulder, not elsewhere classified  Muscle weakness (generalized)  Abnormal posture     Problem List Patient Active Problem List   Diagnosis Date Noted  . Coronary artery disease 12/12/2019  . Atypical chest pain 11/12/2019  . Dyslipidemia 11/12/2019  . Essential hypertension 11/12/2019  . Borderline blood pressure 03/17/2019    Bess Harvest, PTA 06/21/20 5:59 PM   [facility] [address]  [place], [place], [address] Phone: [phone]   Fax:  [phone]  Name: AUBRIE LUCIEN MRN: [id number] Date of Birth: 1955-10-31

## 2020-06-22 ENCOUNTER — Encounter: Payer: Self-pay | Admitting: Physical Therapy

## 2020-06-22 ENCOUNTER — Ambulatory Visit: Payer: 59 | Admitting: Physical Therapy

## 2020-06-22 DIAGNOSIS — M6281 Muscle weakness (generalized): Secondary | ICD-10-CM | POA: Diagnosis not present

## 2020-06-22 DIAGNOSIS — M25511 Pain in right shoulder: Secondary | ICD-10-CM

## 2020-06-22 DIAGNOSIS — M542 Cervicalgia: Secondary | ICD-10-CM | POA: Diagnosis not present

## 2020-06-22 DIAGNOSIS — M25611 Stiffness of right shoulder, not elsewhere classified: Secondary | ICD-10-CM

## 2020-06-22 DIAGNOSIS — R293 Abnormal posture: Secondary | ICD-10-CM

## 2020-06-22 DIAGNOSIS — M5412 Radiculopathy, cervical region: Secondary | ICD-10-CM | POA: Diagnosis not present

## 2020-06-22 NOTE — Therapy (Signed)
[facility] [address]  [place], [place], [address] Phone: [phone]   Fax:  [phone]  Physical Therapy Treatment / Progress Note  Patient Details  Name: Patricia Rush MRN: [id number] Date of Birth: 02/28/1956 Referring Provider (PT): Jean Rosenthal, MD   Encounter Date: 06/22/2020   PT End of Session - 06/22/20 1619    Visit Number 5    Number of Visits 12    Date for PT Re-Evaluation 07/15/20    Authorization Type [facility]    PT Start Time 1619    PT Stop Time 1701    PT Time Calculation (min) 42 min    Activity Tolerance Patient tolerated treatment well    Behavior During Therapy [facility] for tasks assessed/performed           Past Medical History:  Diagnosis Date  . Atypical chest pain 11/12/2019  . Borderline blood pressure 03/17/2019  . Chicken pox   . Dyslipidemia 11/12/2019  . Essential hypertension 11/12/2019  . [place] measles   . Seasonal allergies     Past Surgical History:  Procedure Laterality Date  . AUGMENTATION MAMMAPLASTY    . MANDIBLE SURGERY    . TONSILLECTOMY      There were no vitals filed for this visit.   Subjective Assessment - 06/22/20 1621    Subjective Pt reports excellent relief from DN, noting L shoulder now more relaxed with neck visibly sloping towards shoulder while R remains more tense and goes straight across.    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 The overall alignment is well-maintained. Shoulder unremarkable.    Patient Stated Goals "to know what exercises and/or activities to work on to help my shoulder"    Currently in Pain? Yes    Pain Score 4     Pain Location Shoulder    Pain Orientation Right    Pain Descriptors / Indicators Dull   "feels like I was punched in the deltoid"   Pain Type Acute pain    Pain Frequency Intermittent              [facility] PT  Assessment - 06/22/20 1619      Assessment   Medical Diagnosis Chronic R shoulder pain, neck pain    Referring Provider (PT) Jean Rosenthal, MD    Onset Date/Surgical Date 04/26/20    Hand Dominance Right    Next MD Visit 06/23/20      AROM   Right Shoulder Flexion 138 Degrees    Right Shoulder ABduction 141 Degrees    Right Shoulder Internal Rotation --   FER to T9   Right Shoulder External Rotation --   FER to T2   Left Shoulder Flexion 146 Degrees    Left Shoulder ABduction 134 Degrees    Left Shoulder Internal Rotation --   FIR to T8   Left Shoulder External Rotation --   FER to T1   Cervical Flexion 55    Cervical Extension 52    Cervical - Right Side Bend 32    Cervical - Left Side Bend 37    Cervical - Right Rotation 52    Cervical - Left Rotation 64      Strength   Right Shoulder Flexion 4/5    Right Shoulder ABduction 4-/5    Right Shoulder Internal Rotation 4+/5    Right Shoulder External Rotation  4+/5    Left Shoulder Flexion 4/5    Left Shoulder ABduction 4+/5    Left Shoulder Internal Rotation 5/5    Left Shoulder External Rotation 4+/5                         [facility] Adult PT Treatment/Exercise - 06/22/20 1619      Exercises   Exercises Shoulder      Shoulder Exercises: ROM/Strengthening   UBE (Upper Arm Bike) L3.0 x 6 min (3' fwd, 3' back)      Manual Therapy   Manual Therapy Soft tissue mobilization;Myofascial release    Manual therapy comments skilled palpation and monitoring during DN    Soft tissue mobilization STM/DTM to R anterolateral deltoid, UT & LS - multiple taut bands and TPs identified    Myofascial Release manual TPR to R anterolateral deltoid, UT & LS - decreased muscle tension and improved ROM following MT & DN            Trigger Point Dry Needling - 06/22/20 1619    Consent Given? Yes    Muscles Treated Head and Neck Upper trapezius;Levator scapulae   Rt   Muscles Treated Upper Quadrant Deltoid   Rt   Upper  Trapezius Response Twitch reponse elicited;Palpable increased muscle length    Levator Scapulae Response Twitch response elicited;Palpable increased muscle length    Deltoid Response Twitch response elicited;Palpable increased muscle length                  PT Short Term Goals - 06/22/20 1625      PT SHORT TERM GOAL #1   Title Patient will be independent with initial HEP    Status Achieved   06/21/20     PT SHORT TERM GOAL #2   Title Patient will verbalize/demonstrate good awareness of neutral spine and shoulder posture and proper body mechanics for daily tasks    Status Achieved   06/21/20            PT Long Term Goals - 06/22/20 1626      PT LONG TERM GOAL #1   Title Patient will be independent with ongoing/advanced HEP    Status On-going    Target Date 07/15/20      PT LONG TERM GOAL #2   Title Patient to demonstrate improved tissue quality and pliability as noted by reduced tissue tightness and tenderness to palpation    Status On-going    Target Date 07/15/20      PT LONG TERM GOAL #3   Title Patient to improve neck & R shoulder AROM to WFL/WNL without pain provocation    Status Partially Met   06/22/20 - Progressing with most neck and shoulder AROM   Target Date 07/15/20      PT LONG TERM GOAL #4   Title Patient will demonstrate improved B strength to >/= 4+/5 for functional UE use    Status Partially Met    Target Date 07/15/20      PT LONG TERM GOAL #5   Title Patient to report ability to perform ADLs, household, and work-related tasks without limitation due to R shoulder pain, LOM or weakness    Status On-going    Target Date 07/15/20                 Plan - 06/22/20 1627    Clinical Impression Statement Patricia Rush is demonstrating good initial progress with PT and is very pleased  with her response to DN to her L shoulder musculature, noting lasting relief with L shoulder posture now more relaxed with decreased muscle tension as compared to  persistent more tensed posture in R shoulder. She also notes better tolerance for stretches and exercises since DN and feels more motivated to complete HEP. Gains noted with most cervical and B shoulder AROM with only cervical and shoulder flexion unchanged. B shoulder strength also improving although R shoulder remains limited as compared to L. All STGs met and progress observed with most LTGs. Given positive response with DN to L shoulder, manual therapy and DN performed addressing R anterior and upper shoulder today. Patricia Rush will likely also benefit from DN to B posterior shoulder/periscapular muscles as well as cervical and thoracic paraspinals to further normalize muscle tension and promote restoration of full shoulder and cervical AROM as well as improve tolerance for continued postural strengthening to prevent recurrence of pain.    Comorbidities HTN, augmentation mammaplasty    Rehab Potential Good    PT Frequency 2x / week    PT Duration 6 weeks    PT Treatment/Interventions ADLs/Self Care Home Management;Cryotherapy;Electrical Stimulation;Iontophoresis 83m/ml Dexamethasone;Moist Heat;Traction;Ultrasound;Functional mobility training;Therapeutic activities;Therapeutic exercise;Neuromuscular re-education;Patient/family education;Manual techniques;Passive range of motion;Dry needling;Taping;Vasopneumatic Device;Joint Manipulations;Spinal Manipulations    PT Next Visit Plan Postural education and strengthening; manual therapy + DN as indicated to address increased muscle tension; modalities PRN    PT Home Exercise Plan 11/11 - UT, LS & doorway pec/deltoid stretches, thoracic extension mobs, scap retraction, red TB rows/retraction    Consulted and Agree with Plan of Care Patient           Patient will benefit from skilled therapeutic intervention in order to improve the following deficits and impairments:  Decreased activity tolerance, Decreased range of motion, Decreased strength, Hypomobility,  Increased fascial restricitons, Increased muscle spasms, Impaired perceived functional ability, Impaired flexibility, Impaired UE functional use, Improper body mechanics, Postural dysfunction, Pain  Visit Diagnosis: Acute pain of right shoulder  Stiffness of right shoulder, not elsewhere classified  Muscle weakness (generalized)  Abnormal posture  Cervicalgia  Radiculopathy, cervical region     Problem List Patient Active Problem List   Diagnosis Date Noted  . Coronary artery disease 12/12/2019  . Atypical chest pain 11/12/2019  . Dyslipidemia 11/12/2019  . Essential hypertension 11/12/2019  . Borderline blood pressure 03/17/2019    JPercival Spanish PT, MPT 06/22/2020, 7:14 PM  [facility] [address]Phone: [phone]  Fax:  [phone] Name: Patricia SCHRUPPMRN: [id number] of Birth: [birth year]

## 2020-06-23 ENCOUNTER — Ambulatory Visit: Payer: 59 | Admitting: Orthopaedic Surgery

## 2020-06-23 ENCOUNTER — Encounter: Payer: Self-pay | Admitting: Orthopaedic Surgery

## 2020-06-23 DIAGNOSIS — M25511 Pain in right shoulder: Secondary | ICD-10-CM

## 2020-06-23 DIAGNOSIS — G8929 Other chronic pain: Secondary | ICD-10-CM | POA: Diagnosis not present

## 2020-06-23 DIAGNOSIS — M542 Cervicalgia: Secondary | ICD-10-CM | POA: Diagnosis not present

## 2020-06-23 NOTE — Progress Notes (Signed)
Patricia Rush comes in today for continued follow-up as a relates to neck pain and right shoulder pain.  She states the dry needling with therapy has helped greatly and is given her such relief and release.  She is feeling much better overall.  She has much improved range of motion of both shoulders and her neck.  Overall she looks great.  Her rotator cuff is strong.  At this point follow-up can be as needed.  She is transitioning to a home exercise program.  If she needs this at all she will let us know.  All questions and concerns were answered and addressed.

## 2020-06-28 ENCOUNTER — Other Ambulatory Visit: Payer: Self-pay

## 2020-06-28 ENCOUNTER — Encounter: Payer: Self-pay | Admitting: Physical Therapy

## 2020-06-28 ENCOUNTER — Ambulatory Visit: Payer: 59 | Attending: Orthopaedic Surgery | Admitting: Physical Therapy

## 2020-06-28 DIAGNOSIS — M25611 Stiffness of right shoulder, not elsewhere classified: Secondary | ICD-10-CM | POA: Insufficient documentation

## 2020-06-28 DIAGNOSIS — R293 Abnormal posture: Secondary | ICD-10-CM | POA: Insufficient documentation

## 2020-06-28 DIAGNOSIS — M5412 Radiculopathy, cervical region: Secondary | ICD-10-CM | POA: Insufficient documentation

## 2020-06-28 DIAGNOSIS — M25511 Pain in right shoulder: Secondary | ICD-10-CM | POA: Insufficient documentation

## 2020-06-28 DIAGNOSIS — M542 Cervicalgia: Secondary | ICD-10-CM | POA: Diagnosis not present

## 2020-06-28 DIAGNOSIS — M6281 Muscle weakness (generalized): Secondary | ICD-10-CM | POA: Diagnosis not present

## 2020-06-28 NOTE — Therapy (Signed)
[facility] [address]  [place], [place], [address] Phone: [phone]   Fax:  [phone]  Physical Therapy Treatment  Patient Details  Name: Patricia Rush MRN: [id number] Date of Birth: 02-Mar-1956 Referring Provider (PT): Jean Rosenthal, MD   Encounter Date: 06/28/2020   PT End of Session - 06/28/20 0802    Visit Number 6    Number of Visits 12    Date for PT Re-Evaluation 07/15/20    Authorization Type [facility]    Progress Note Due on Visit 12   MD PN on visit #5 - 06/22/20   PT Start Time 0802    PT Stop Time 0844    PT Time Calculation (min) 42 min    Activity Tolerance Patient tolerated treatment well    Behavior During Therapy [facility] for tasks assessed/performed           Past Medical History:  Diagnosis Date  . Atypical chest pain 11/12/2019  . Borderline blood pressure 03/17/2019  . Chicken pox   . Dyslipidemia 11/12/2019  . Essential hypertension 11/12/2019  . [place] measles   . Seasonal allergies     Past Surgical History:  Procedure Laterality Date  . AUGMENTATION MAMMAPLASTY    . MANDIBLE SURGERY    . TONSILLECTOMY      There were no vitals filed for this visit.   Subjective Assessment - 06/28/20 0806    Subjective Pt reports MD f/u went well with no new interventions recommended and will only f/u PRN. Feels like DN last visit helped even her out.    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 The overall alignment is well-maintained. Shoulder unremarkable.    Patient Stated Goals "to know what exercises and/or activities to work on to help my shoulder"    Pain Score 3    3-4/10   Pain Location Shoulder    Pain Orientation Right    Pain Descriptors / Indicators Tightness    Pain Type Acute pain    Pain Frequency Intermittent              [facility] PT Assessment - 06/28/20 0802       Assessment   Medical Diagnosis Chronic R shoulder pain, neck pain    Referring Provider (PT) Jean Rosenthal, MD    Onset Date/Surgical Date 04/26/20    Hand Dominance Right    Next MD Visit PRN                         [facility] Adult PT Treatment/Exercise - 06/28/20 0802      Exercises   Exercises Shoulder      Shoulder Exercises: Prone   Extension Both;10 reps;Strengthening    Extension Limitations I's + chin tuck/slight thoracic extension over [place] Pball    External Rotation Both;10 reps;Strengthening    External Rotation Limitations W's + chin tuck/slight thoracic extension over [place] Pball    Horizontal ABduction 1 Both;10 reps;Strengthening    Horizontal ABduction 1 Limitations T's + chin tuck/slight thoracic extension over [place] Pball    Horizontal ABduction 2 Both;10 reps;Strengthening    Horizontal ABduction 2 Limitations Y's + chin tuck/slight thoracic extension over [place] Pball      Shoulder Exercises: Sidelying   Other Sidelying Exercises R/L open book stretch 10 x 5"      Shoulder Exercises: Standing  Horizontal ABduction Both;10 reps;Strengthening;Theraband    Theraband Level (Shoulder Horizontal ABduction) Level 3 (Tercero)    Horizontal ABduction Limitations + scap retraction, standing with back along doorframe    External Rotation Both;10 reps;Strengthening;Theraband    Theraband Level (Shoulder External Rotation) Level 3 (Carpino)    External Rotation Limitations + scap retraction, standing with back along doorframe    Extension Both;15 reps;Strengthening;Theraband    Theraband Level (Shoulder Extension) Level 3 (Hansen)    Row Both;15 reps;Strengthening;Theraband    Theraband Level (Shoulder Row) Level 3 (Spayd)    Row Limitations cues to increase hold times to ~5"    Diagonals Both;10 reps;Strengthening;Theraband    Theraband Level (Shoulder Diagonals) Level 3 (Alen)    Diagonals Limitations + scap retraction, standing with back along  doorframe; pt noting increased effort with flexion portion of diagonal (may need to reduce resistance to red TB next visit)      Shoulder Exercises: Therapy Ball   Flexion Both;10 reps    Flexion Limitations slight lean in for stretch at top of motion      Shoulder Exercises: ROM/Strengthening   UBE (Upper Arm Bike) L3.0 x 6 min (3' fwd, 3' back)    Wall Pushups 10 reps    "W" Arms Soman TB "W" row 10 x 5"                    PT Short Term Goals - 06/22/20 1625      PT SHORT TERM GOAL #1   Title Patient will be independent with initial HEP    Status Achieved   06/21/20     PT SHORT TERM GOAL #2   Title Patient will verbalize/demonstrate good awareness of neutral spine and shoulder posture and proper body mechanics for daily tasks    Status Achieved   06/21/20            PT Long Term Goals - 06/22/20 1626      PT LONG TERM GOAL #1   Title Patient will be independent with ongoing/advanced HEP    Status On-going    Target Date 07/15/20      PT LONG TERM GOAL #2   Title Patient to demonstrate improved tissue quality and pliability as noted by reduced tissue tightness and tenderness to palpation    Status On-going    Target Date 07/15/20      PT LONG TERM GOAL #3   Title Patient to improve neck & R shoulder AROM to WFL/WNL without pain provocation    Status Partially Met   06/22/20 - Progressing with most neck and shoulder AROM   Target Date 07/15/20      PT LONG TERM GOAL #4   Title Patient will demonstrate improved B strength to >/= 4+/5 for functional UE use    Status Partially Met    Target Date 07/15/20      PT LONG TERM GOAL #5   Title Patient to report ability to perform ADLs, household, and work-related tasks without limitation due to R shoulder pain, LOM or weakness    Status On-going    Target Date 07/15/20                 Plan - 06/28/20 0809    Clinical Impression Statement Aziza reports good relief from latest DN with shoulders feeling  more symmetrical now. Denies need for further DN today, therefore focused on continued progression of stretching and strengthening. Good tolerance for all exercises today and if  no issues following today's progression will plan for HEP update next visit.    Comorbidities HTN, augmentation mammaplasty    Rehab Potential Good    PT Frequency 2x / week    PT Duration 6 weeks    PT Treatment/Interventions ADLs/Self Care Home Management;Cryotherapy;Electrical Stimulation;Iontophoresis 25m/ml Dexamethasone;Moist Heat;Traction;Ultrasound;Functional mobility training;Therapeutic activities;Therapeutic exercise;Neuromuscular re-education;Patient/family education;Manual techniques;Passive range of motion;Dry needling;Taping;Vasopneumatic Device;Joint Manipulations;Spinal Manipulations    PT Next Visit Plan Postural education, stretching and strengthening - update strengthening HPE; manual therapy + DN as indicated to address increased muscle tension; modalities PRN    PT Home Exercise Plan 11/11 - UT, LS & doorway pec/deltoid stretches, thoracic extension mobs, scap retraction, red TB rows/retraction    Consulted and Agree with Plan of Care Patient           Patient will benefit from skilled therapeutic intervention in order to improve the following deficits and impairments:  Decreased activity tolerance, Decreased range of motion, Decreased strength, Hypomobility, Increased fascial restricitons, Increased muscle spasms, Impaired perceived functional ability, Impaired flexibility, Impaired UE functional use, Improper body mechanics, Postural dysfunction, Pain  Visit Diagnosis: Acute pain of right shoulder  Stiffness of right shoulder, not elsewhere classified  Muscle weakness (generalized)  Abnormal posture  Cervicalgia  Radiculopathy, cervical region     Problem List Patient Active Problem List   Diagnosis Date Noted  . Coronary artery disease 12/12/2019  . Atypical chest pain 11/12/2019    . Dyslipidemia 11/12/2019  . Essential hypertension 11/12/2019  . Borderline blood pressure 03/17/2019    JPercival Spanish PT, MPT 06/28/2020, 9:30 AM  [facility] [place] [address]Phone: [phone]  Fax:  [phone] Name: TWOODROW DULSKIMRN: [id number] of Birth: 103-Jan-1957

## 2020-06-29 ENCOUNTER — Ambulatory Visit: Payer: 59

## 2020-06-29 DIAGNOSIS — M6281 Muscle weakness (generalized): Secondary | ICD-10-CM

## 2020-06-29 DIAGNOSIS — M25511 Pain in right shoulder: Secondary | ICD-10-CM

## 2020-06-29 DIAGNOSIS — M542 Cervicalgia: Secondary | ICD-10-CM

## 2020-06-29 DIAGNOSIS — M25611 Stiffness of right shoulder, not elsewhere classified: Secondary | ICD-10-CM

## 2020-06-29 DIAGNOSIS — M5412 Radiculopathy, cervical region: Secondary | ICD-10-CM | POA: Diagnosis not present

## 2020-06-29 DIAGNOSIS — R293 Abnormal posture: Secondary | ICD-10-CM | POA: Diagnosis not present

## 2020-06-29 NOTE — Therapy (Signed)
[facility] [address]  [place], [place], [address] Phone: [phone]   Fax:  [phone]  Physical Therapy Treatment  Patient Details  Name: Patricia Rush MRN: [id number] Date of Birth: 03-03-1956 Referring Provider (PT): Jean Rosenthal, MD   Encounter Date: 06/29/2020   PT End of Session - 06/29/20 0806    Visit Number 7    Number of Visits 12    Date for PT Re-Evaluation 07/15/20    Authorization Type [facility]    Progress Note Due on Visit 12   MD PN on visit #5 - 06/22/20   PT Start Time 0800    PT Stop Time 0843    PT Time Calculation (min) 43 min    Activity Tolerance Patient tolerated treatment well    Behavior During Therapy [facility] for tasks assessed/performed           Past Medical History:  Diagnosis Date  . Atypical chest pain 11/12/2019  . Borderline blood pressure 03/17/2019  . Chicken pox   . Dyslipidemia 11/12/2019  . Essential hypertension 11/12/2019  . [place] measles   . Seasonal allergies     Past Surgical History:  Procedure Laterality Date  . AUGMENTATION MAMMAPLASTY    . MANDIBLE SURGERY    . TONSILLECTOMY      There were no vitals filed for this visit.   Subjective Assessment - 06/29/20 0803    Subjective Pt. doing ok with no new complaints.    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 The overall alignment is well-maintained. Shoulder unremarkable.    Patient Stated Goals "to know what exercises and/or activities to work on to help my shoulder"    Currently in Pain? Yes    Pain Score 2    exercise pain   Pain Location Arm    Pain Orientation Right    Pain Descriptors / Indicators --   "exercise pain"   Pain Type Acute pain    Pain Onset More than a month ago    Pain Frequency Intermittent    Multiple Pain Sites No                             [facility] Adult PT  Treatment/Exercise - 06/29/20 0001      Self-Care   Self-Care Other Self-Care Comments    Other Self-Care Comments  Self-ball release on wall and laying sidelying on table to R teres group       Shoulder Exercises: Prone   Extension Both;10 reps;Strengthening    Extension Limitations I's + chin tuck/slight thoracic extension over [place] Pball    Horizontal ABduction 1 Both;10 reps;Strengthening    Horizontal ABduction 1 Limitations T's + chin tuck/slight thoracic extension over [place] Pball    Horizontal ABduction 2 Both;10 reps;Strengthening    Horizontal ABduction 2 Limitations Y's + chin tuck/slight thoracic extension over [place] Pball      Shoulder Exercises: ROM/Strengthening   UBE (Upper Arm Bike) L3.0 x 6 min (3' fwd, 3' back)    Cybex Row 10 reps    Cybex Row Limitations 25# x low and mid x 10      Shoulder Exercises: Stretch   Other Shoulder Stretches R lats stretch in prone prayer and standing positions x 30 sec      Manual Therapy   Manual Therapy  Soft tissue mobilization    Manual therapy comments sidelying     Soft tissue mobilization DTM to R teres group - min tenderness - very tight                   PT Education - 06/29/20 0913    Education Details HEP update    Person(s) Educated Patient    Methods Explanation;Demonstration;Verbal cues;Handout    Comprehension Verbalized understanding;Returned demonstration;Verbal cues required            PT Short Term Goals - 06/22/20 1625      PT SHORT TERM GOAL #1   Title Patient will be independent with initial HEP    Status Achieved   06/21/20     PT SHORT TERM GOAL #2   Title Patient will verbalize/demonstrate good awareness of neutral spine and shoulder posture and proper body mechanics for daily tasks    Status Achieved   06/21/20            PT Long Term Goals - 06/22/20 1626      PT LONG TERM GOAL #1   Title Patient will be independent with ongoing/advanced HEP    Status On-going    Target Date  07/15/20      PT LONG TERM GOAL #2   Title Patient to demonstrate improved tissue quality and pliability as noted by reduced tissue tightness and tenderness to palpation    Status On-going    Target Date 07/15/20      PT LONG TERM GOAL #3   Title Patient to improve neck & R shoulder AROM to WFL/WNL without pain provocation    Status Partially Met   06/22/20 - Progressing with most neck and shoulder AROM   Target Date 07/15/20      PT LONG TERM GOAL #4   Title Patient will demonstrate improved B strength to >/= 4+/5 for functional UE use    Status Partially Met    Target Date 07/15/20      PT LONG TERM GOAL #5   Title Patient to report ability to perform ADLs, household, and work-related tasks without limitation due to R shoulder pain, LOM or weakness    Status On-going    Target Date 07/15/20                 Plan - 06/29/20 5027    Clinical Impression Statement [place] without new complaints.  Manual therapy revealing mild tenderness/tightness in R teres group/lats thus addressed this with DTM and stretching without increased pain.  Progressed HEP with updated focused on thoracic/periscapular strengthening and lats stretching activities with pt. verbalizing understanding of handout.  Pt. verbalizing she is progressing with therapy well.    Comorbidities HTN, augmentation mammaplasty    Rehab Potential Good    PT Frequency 2x / week    PT Duration 6 weeks    PT Treatment/Interventions ADLs/Self Care Home Management;Cryotherapy;Electrical Stimulation;Iontophoresis 11m/ml Dexamethasone;Moist Heat;Traction;Ultrasound;Functional mobility training;Therapeutic activities;Therapeutic exercise;Neuromuscular re-education;Patient/family education;Manual techniques;Passive range of motion;Dry needling;Taping;Vasopneumatic Device;Joint Manipulations;Spinal Manipulations    PT Next Visit Plan Postural education, stretching and strengthening; manual therapy + DN as indicated to address increased  muscle tension; modalities PRN    PT Home Exercise Plan 11/11 - UT, LS & doorway pec/deltoid stretches, thoracic extension mobs, scap retraction, red TB rows/retraction; 12/07 - prone I's, T's, Y's, lats stretch    Consulted and Agree with Plan of Care Patient           Patient will benefit  from skilled therapeutic intervention in order to improve the following deficits and impairments:  Decreased activity tolerance, Decreased range of motion, Decreased strength, Hypomobility, Increased fascial restricitons, Increased muscle spasms, Impaired perceived functional ability, Impaired flexibility, Impaired UE functional use, Improper body mechanics, Postural dysfunction, Pain  Visit Diagnosis: Acute pain of right shoulder  Stiffness of right shoulder, not elsewhere classified  Muscle weakness (generalized)  Abnormal posture  Cervicalgia  Radiculopathy, cervical region     Problem List Patient Active Problem List   Diagnosis Date Noted  . Coronary artery disease 12/12/2019  . Atypical chest pain 11/12/2019  . Dyslipidemia 11/12/2019  . Essential hypertension 11/12/2019  . Borderline blood pressure 03/17/2019    Bess Harvest, PTA 06/29/20 11:29 AM   [facility] [address]  [place], [place], [address] Phone: [phone]   Fax:  [phone]  Name: Patricia Rush MRN: [id number] Date of Birth: March 17, 1956

## 2020-07-05 ENCOUNTER — Other Ambulatory Visit: Payer: Self-pay

## 2020-07-05 ENCOUNTER — Encounter: Payer: Self-pay | Admitting: Physical Therapy

## 2020-07-05 ENCOUNTER — Ambulatory Visit: Payer: 59 | Admitting: Physical Therapy

## 2020-07-05 DIAGNOSIS — M542 Cervicalgia: Secondary | ICD-10-CM

## 2020-07-05 DIAGNOSIS — M5412 Radiculopathy, cervical region: Secondary | ICD-10-CM

## 2020-07-05 DIAGNOSIS — M25511 Pain in right shoulder: Secondary | ICD-10-CM

## 2020-07-05 DIAGNOSIS — R293 Abnormal posture: Secondary | ICD-10-CM | POA: Diagnosis not present

## 2020-07-05 DIAGNOSIS — M6281 Muscle weakness (generalized): Secondary | ICD-10-CM

## 2020-07-05 DIAGNOSIS — M25611 Stiffness of right shoulder, not elsewhere classified: Secondary | ICD-10-CM | POA: Diagnosis not present

## 2020-07-05 NOTE — Therapy (Signed)
[facility] [address]  [place], [place], [address] Phone: [phone]   Fax:  [phone]  Physical Therapy Treatment  Patient Details  Name: Patricia Rush MRN: [id number] Date of Birth: April 24, 1956 Referring Provider (PT): Patricia Rosenthal, MD   Encounter Date: 07/05/2020   PT End of Session - 07/05/20 0803    Visit Number 8    Number of Visits 12    Date for PT Re-Evaluation 07/15/20    Authorization Type [facility]    Progress Note Due on Visit 12   MD PN on visit #5 - 06/22/20   PT Start Time 0803    PT Stop Time 0844    PT Time Calculation (min) 41 min    Activity Tolerance Patient tolerated treatment well    Behavior During Therapy [facility] for tasks assessed/performed           Past Medical History:  Diagnosis Date  . Atypical chest pain 11/12/2019  . Borderline blood pressure 03/17/2019  . Chicken pox   . Dyslipidemia 11/12/2019  . Essential hypertension 11/12/2019  . [place] measles   . Seasonal allergies     Past Surgical History:  Procedure Laterality Date  . AUGMENTATION MAMMAPLASTY    . MANDIBLE SURGERY    . TONSILLECTOMY      There were no vitals filed for this visit.   Subjective Assessment - 07/05/20 0807    Subjective Pt reports her shoulder is doing well today even after working 3rd shift 3 nights in a row. No issues with latest HEP update.    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 The overall alignment is well-maintained. Shoulder unremarkable.    Patient Stated Goals "to know what exercises and/or activities to work on to help my shoulder"    Currently in Pain? No/denies    Pain Onset More than a month ago                             [facility] Adult PT Treatment/Exercise - 07/05/20 0803      Exercises   Exercises Shoulder      Shoulder Exercises: Standing   Diagonals  Both;10 reps;Strengthening;Theraband    Theraband Level (Shoulder Diagonals) Level 3 ([place])    Diagonals Limitations + scap retraction, standing with back along doorframe; pt noting increased effort with flexion portion of diagonal on R but no pain      Shoulder Exercises: Therapy Ball   Flexion Both;10 reps;Right    Flexion Limitations + R hand lift-off with 2# cuff wt      Shoulder Exercises: ROM/Strengthening   UBE (Upper Arm Bike) L3.0 x 6 min (3' fwd, 3' back)    Lat Pull 10 reps    Lat Pull Limitations 15# - standing straight arm pull-down; 20# seated pull-down    Cybex Press 10 reps    Cybex Press Limitations 20# - vertical handles    Cybex Row 15 reps;10 reps    Cybex Row Limitations 25# - low (15 reps) & mid (10 reps)    Wall Pushups 10 reps    Wall Pushups Limitations Patricia [place] Pball on wall    Ball on Wall R shoulder CW/CCW circles x 10 each, ABCs x 1 set, small overhead arc x 10    Rhythmic Stabilization, Seated R/L shoulder flexion at 90 2  x 30 sec - 2nd set with eyes closed                    PT Short Term Goals - 06/22/20 1625      PT SHORT TERM GOAL #1   Title Patient will be independent with initial HEP    Status Achieved   06/21/20     PT SHORT TERM GOAL #2   Title Patient will verbalize/demonstrate good awareness of neutral spine and shoulder posture and proper body mechanics for daily tasks    Status Achieved   06/21/20            PT Long Term Goals - 06/22/20 1626      PT LONG TERM GOAL #1   Title Patient will be independent with ongoing/advanced HEP    Status On-going    Target Date 07/15/20      PT LONG TERM GOAL #2   Title Patient to demonstrate improved tissue quality and pliability as noted by reduced tissue tightness and tenderness to palpation    Status On-going    Target Date 07/15/20      PT LONG TERM GOAL #3   Title Patient to improve neck & R shoulder AROM to WFL/WNL without pain provocation    Status Partially Met   06/22/20  - Progressing with most neck and shoulder AROM   Target Date 07/15/20      PT LONG TERM GOAL #4   Title Patient will demonstrate improved B strength to >/= 4+/5 for functional UE use    Status Partially Met    Target Date 07/15/20      PT LONG TERM GOAL #5   Title Patient to report ability to perform ADLs, household, and work-related tasks without limitation due to R shoulder pain, LOM or weakness    Status On-going    Target Date 07/15/20                 Plan - 07/05/20 0810    Clinical Impression Statement Patricia Rush denies pain today and notes improving tolerance for work even after working 3rd shift 3 consecutive nights. She still notes weakness/fatigue with flexion activities in R shoulder, therefore today's session targeting continued strengthening and overhead ROM as well as weight machines for gym carryover. 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    Comorbidities HTN, augmentation mammaplasty    Rehab Potential Good    PT Frequency 2x / week    PT Duration 6 weeks    PT Treatment/Interventions ADLs/Self Care Home Management;Cryotherapy;Electrical Stimulation;Iontophoresis 72m/ml Dexamethasone;Moist Heat;Traction;Ultrasound;Functional mobility training;Therapeutic activities;Therapeutic exercise;Neuromuscular re-education;Patient/family education;Manual techniques;Passive range of motion;Dry needling;Taping;Vasopneumatic Device;Joint Manipulations;Spinal Manipulations    PT Next Visit Plan Goal assessment with potential transition to HEP +/- 30-day hold; postural education, stretching and strengthening; manual therapy + DN as indicated to address increased muscle tension; modalities PRN    PT Home Exercise Plan 11/11 - UT, LS & doorway pec/deltoid stretches, thoracic extension mobs, scap retraction, red TB rows/retraction; 12/7 - prone I's, T's, Y's, lats stretch    Consulted  and Agree with Plan of Care Patient           Patient will benefit from skilled therapeutic intervention in order to improve the following deficits and impairments:  Decreased activity tolerance,Decreased range of motion,Decreased strength,Hypomobility,Increased fascial restricitons,Increased muscle spasms,Impaired perceived functional ability,Impaired flexibility,Impaired UE functional use,Improper  body mechanics,Postural dysfunction,Pain  Visit Diagnosis: Acute pain of right shoulder  Stiffness of right shoulder, not elsewhere classified  Muscle weakness (generalized)  Abnormal posture  Cervicalgia  Radiculopathy, cervical region     Problem List Patient Active Problem List   Diagnosis Date Noted  . Coronary artery disease 12/12/2019  . Atypical chest pain 11/12/2019  . Dyslipidemia 11/12/2019  . Essential hypertension 11/12/2019  . Borderline blood pressure 03/17/2019    Percival Spanish, PT, MPT 07/05/2020, 11:30 AM  [facility] [address]  [place], [place], [address] Phone: [phone]   Fax:  [phone]  Name: Patricia Rush MRN: [id number] Date of Birth: April 16, 1956

## 2020-07-07 ENCOUNTER — Ambulatory Visit: Payer: 59

## 2020-07-07 ENCOUNTER — Other Ambulatory Visit: Payer: Self-pay

## 2020-07-07 DIAGNOSIS — M25611 Stiffness of right shoulder, not elsewhere classified: Secondary | ICD-10-CM

## 2020-07-07 DIAGNOSIS — R293 Abnormal posture: Secondary | ICD-10-CM

## 2020-07-07 DIAGNOSIS — M6281 Muscle weakness (generalized): Secondary | ICD-10-CM | POA: Diagnosis not present

## 2020-07-07 DIAGNOSIS — M25511 Pain in right shoulder: Secondary | ICD-10-CM

## 2020-07-07 DIAGNOSIS — M5412 Radiculopathy, cervical region: Secondary | ICD-10-CM | POA: Diagnosis not present

## 2020-07-07 DIAGNOSIS — M542 Cervicalgia: Secondary | ICD-10-CM

## 2020-07-07 NOTE — Therapy (Addendum)
[facility] [address]  [place], [place], [address] Phone: [phone]   Fax:  [phone]  Physical Therapy Treatment / Discharge Summary  Patient Details  Name: Patricia Rush MRN: [id number] Date of Birth: [birth year] Referring Provider (PT): Jean Rosenthal, MD   Encounter Date: 07/07/2020   PT End of Session - 07/07/20 0804    Visit Number 9    Number of Visits 12    Date for PT Re-Evaluation 07/15/20    Authorization Type [facility]    Progress Note Due on Visit 12   MD PN on visit #5 - 06/22/20   PT Start Time 0802    PT Stop Time 0840    PT Time Calculation (min) 38 min    Activity Tolerance Patient tolerated treatment well    Behavior During Therapy [facility] for tasks assessed/performed           Past Medical History:  Diagnosis Date  . Atypical chest pain 11/12/2019  . Borderline blood pressure 03/17/2019  . Chicken pox   . Dyslipidemia 11/12/2019  . Essential hypertension 11/12/2019  . [place] measles   . Seasonal allergies     Past Surgical History:  Procedure Laterality Date  . AUGMENTATION MAMMAPLASTY    . MANDIBLE SURGERY    . TONSILLECTOMY      There were no vitals filed for this visit.   Subjective Assessment - 07/07/20 0804    Subjective Pt. reporting she has had ~ 50% improvement since starting therapy.    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 The overall alignment is well-maintained. Shoulder unremarkable.    Patient Stated Goals "to know what exercises and/or activities to work on to help my shoulder"    Currently in Pain? No/denies    Pain Score 0-No pain    Multiple Pain Sites No              [facility] PT Assessment - 07/07/20 0001      Observation/Other Assessments   Focus on Therapeutic Outcomes (FOTO)  FOTO: 71% (29% limitation)      AROM   AROM Assessment Site Shoulder;Cervical     Right/Left Shoulder Right    Right Shoulder Flexion 145 Degrees    Right Shoulder ABduction 150 Degrees    Right Shoulder Internal Rotation --   FIR to T9   Right Shoulder External Rotation --   FER to T1   Cervical Flexion 55    Cervical Extension 50    Cervical - Right Side Bend 32    Cervical - Left Side Bend 40    Cervical - Right Rotation 52    Cervical - Left Rotation 68      Strength   Strength Assessment Site Shoulder    Right/Left Shoulder Right;Left    Right Shoulder Flexion 4+/5    Right Shoulder ABduction 4/5    Right Shoulder Internal Rotation 5/5    Right Shoulder External Rotation 4+/5    Left Shoulder Flexion 4+/5    Left Shoulder ABduction 4/5    Left Shoulder Internal Rotation 5/5    Left Shoulder External Rotation 4+/5                         [facility] Adult PT Treatment/Exercise - 07/07/20 0001      Self-Care   Self-Care Other Self-Care Comments  Other Self-Care Comments  Discussion of comprehensive HEP to check for activities to address remaining strength and ROM deficits at cervical spine and shoulder      Shoulder Exercises: ROM/Strengthening   UBE (Upper Arm Bike) L3.0 x 6 min (3' fwd, 3' back)    Lat Pull 10 reps    Lat Pull Limitations 20# - standing lasts pulldown/row      Neck Exercises: Stretches   Other Neck Stretches B STM strech x 30sec                  PT Education - 07/07/20 0944    Education Details HEP update    Person(s) Educated Patient    Methods Explanation;Demonstration;Verbal cues;Handout    Comprehension Verbalized understanding;Returned demonstration;Verbal cues required            PT Short Term Goals - 06/22/20 1625      PT SHORT TERM GOAL #1   Title Patient will be independent with initial HEP    Status Achieved   06/21/20     PT SHORT TERM GOAL #2   Title Patient will verbalize/demonstrate good awareness of neutral spine and shoulder posture and proper body mechanics for daily tasks     Status Achieved   06/21/20            PT Long Term Goals - 07/07/20 0805      PT LONG TERM GOAL #1   Title Patient will be independent with ongoing/advanced HEP    Status Partially Met      PT LONG TERM GOAL #2   Title Patient to demonstrate improved tissue quality and pliability as noted by reduced tissue tightness and tenderness to palpation    Status Achieved   07/07/20: pt. noting much improved R anterior deltoid, pec tightness, tension     PT LONG TERM GOAL #3   Title Patient to improve neck & R shoulder AROM to WFL/WNL without pain provocation    Status Partially Met   07/07/20     PT LONG TERM GOAL #4   Title Patient will demonstrate improved B strength to >/= 4+/5 for functional UE use    Status Partially Met      PT LONG TERM GOAL #5   Title Patient to report ability to perform ADLs, household, and work-related tasks without limitation due to R shoulder pain, LOM or weakness    Status Achieved   07/07/20: noting no limitation                Plan - 07/07/20 0806    Clinical Impression Statement Pt. has made good progress with PT.  Wishes to go on 30-day hold from therapy as she feels ready to transition to more of a home exercise program.  Supervising PT approving plan for 30-day hold on therapy.  She notes improved tissue quality (reduced tension/tightness) in R anterior shoulder/pec musculature since starting therapy.  LTG #2 achieved.  Has partially achieved LTG #3 demonstrating improved R shoulder and cervical AROM meeting goal for cervical extension, flexion, L rotation with some limitation remaining in cervical lateral flexion, R rotation and shoulder abduction, flexion.  Has met LTG #4 with exception of R shoulder abduction strength with MMT at 4/5.  HEP updated for continued progress with R shoulder strength and ROM deficits.  Pt. reports she is no longer limited by shoulder pain or LOM with work, household, or ADLs.  LTG #5 met.  Pt. verbalizing understanding  of ongoing HEP and now  on 30-day hold from therapy.    Comorbidities HTN, augmentation mammaplasty    Rehab Potential Good    PT Frequency 2x / week    PT Duration 6 weeks    PT Treatment/Interventions ADLs/Self Care Home Management;Cryotherapy;Electrical Stimulation;Iontophoresis 86m/ml Dexamethasone;Moist Heat;Traction;Ultrasound;Functional mobility training;Therapeutic activities;Therapeutic exercise;Neuromuscular re-education;Patient/family education;Manual techniques;Passive range of motion;Dry needling;Taping;Vasopneumatic Device;Joint Manipulations;Spinal Manipulations    PT Next Visit Plan 30-day hold    PT Home Exercise Plan 11/11 - UT, LS & doorway pec/deltoid stretches, thoracic extension mobs, scap retraction, red TB rows/retraction; 12/7 - prone I's, T's, Y's, lats stretch; 12/15 - SCM stretch, abduction raise with red/[place] band    Consulted and Agree with Plan of Care Patient           Patient will benefit from skilled therapeutic intervention in order to improve the following deficits and impairments:  Decreased activity tolerance,Decreased range of motion,Decreased strength,Hypomobility,Increased fascial restricitons,Increased muscle spasms,Impaired perceived functional ability,Impaired flexibility,Impaired UE functional use,Improper body mechanics,Postural dysfunction,Pain  Visit Diagnosis: Acute pain of right shoulder  Stiffness of right shoulder, not elsewhere classified  Muscle weakness (generalized)  Abnormal posture  Cervicalgia  Radiculopathy, cervical region     Problem List Patient Active Problem List   Diagnosis Date Noted  . Coronary artery disease 12/12/2019  . Atypical chest pain 11/12/2019  . Dyslipidemia 11/12/2019  . Essential hypertension 11/12/2019  . Borderline blood pressure 03/17/2019    MBess Harvest PTA 07/07/20 10:08 AM   [facility] [address] [place] [address]Phone: [phone]  Fax:  [phone] Name: TREAGEN GOATESMRN: [id number] of Birth: 1May 07, 1957  PHYSICAL THERAPY DISCHARGE SUMMARY  Visits from Start of Care: 9  Current functional level related to goals / functional outcomes:   Refer to above clinical impression for status as of last visit on 07/07/2020. Patient was placed on hold for 30 days and has not needed to return to PT, therefore will proceed with discharge from PT for this episode.   Remaining deficits:   As above.   Education / Equipment:   HEP  Plan: Patient agrees to discharge.  Patient goals were partially met. Patient is being discharged due to being pleased with the current functional level.  ?????     JPercival Spanish PT, MPT 08/10/20, 2:34 PM  [facility] [place] [address]Phone: [phone]  Fax:  [phone]

## 2020-09-10 MED FILL — LISINOPRIL 10 MG TABS: 10 | 90 days supply | Qty: 90 | Fill #2

## 2020-09-10 MED FILL — ATORVASTATIN 80 MG TABLET: 80 | 90 days supply | Qty: 90 | Fill #1

## 2020-09-16 ENCOUNTER — Other Ambulatory Visit: Payer: Self-pay

## 2020-09-16 DIAGNOSIS — J302 Other seasonal allergic rhinitis: Secondary | ICD-10-CM | POA: Insufficient documentation

## 2020-09-16 DIAGNOSIS — B019 Varicella without complication: Secondary | ICD-10-CM | POA: Insufficient documentation

## 2020-09-16 DIAGNOSIS — B069 Rubella without complication: Secondary | ICD-10-CM | POA: Insufficient documentation

## 2020-09-18 NOTE — Patient Instructions (Addendum)
Good to see you again today-I will be in touch with your labs To do- mammo and bone density Take care!  Please see me in one year     Health Maintenance, Female Adopting a healthy lifestyle and getting preventive care are important in promoting health and wellness. Ask your health care provider about:  The right schedule for you to have regular tests and exams.  Things you can do on your own to prevent diseases and keep yourself healthy. What should I know about diet, weight, and exercise? Eat a healthy diet  Eat a diet that includes plenty of vegetables, fruits, low-fat dairy products, and lean protein.  Do not eat a lot of foods that are high in solid fats, added sugars, or sodium.   Maintain a healthy weight Body mass index (BMI) is used to identify weight problems. It estimates body fat based on height and weight. Your health care provider can help determine your BMI and help you achieve or maintain a healthy weight. Get regular exercise Get regular exercise. This is one of the most important things you can do for your health. Most adults should:  Exercise for at least 150 minutes each week. The exercise should increase your heart rate and make you sweat (moderate-intensity exercise).  Do strengthening exercises at least twice a week. This is in addition to the moderate-intensity exercise.  Spend less time sitting. Even light physical activity can be beneficial. Watch cholesterol and blood lipids Have your blood tested for lipids and cholesterol at 64 years of age, then have this test every 5 years. Have your cholesterol levels checked more often if:  Your lipid or cholesterol levels are high.  You are older than 64 years of age.  You are at high risk for heart disease. What should I know about cancer screening? Depending on your health history and family history, you may need to have cancer screening at various ages. This may include screening for:  Breast  cancer.  Cervical cancer.  Colorectal cancer.  Skin cancer.  Lung cancer. What should I know about heart disease, diabetes, and high blood pressure? Blood pressure and heart disease  High blood pressure causes heart disease and increases the risk of stroke. This is more likely to develop in people who have high blood pressure readings, are of African descent, or are overweight.  Have your blood pressure checked: ? Every 3-5 years if you are 63-43 years of age. ? Every year if you are 69 years old or older. Diabetes Have regular diabetes screenings. This checks your fasting blood sugar level. Have the screening done:  Once every three years after age 50 if you are at a normal weight and have a low risk for diabetes.  More often and at a younger age if you are overweight or have a high risk for diabetes. What should I know about preventing infection? Hepatitis B If you have a higher risk for hepatitis B, you should be screened for this virus. Talk with your health care provider to find out if you are at risk for hepatitis B infection. Hepatitis C Testing is recommended for:  Everyone born from [birth year] through 1964.  Anyone with known risk factors for hepatitis C. Sexually transmitted infections (STIs)  Get screened for STIs, including gonorrhea and chlamydia, if: ? You are sexually active and are younger than 64 years of age. ? You are older than 64 years of age and your health care provider tells you that you are at  risk for this type of infection. ? Your sexual activity has changed since you were last screened, and you are at increased risk for chlamydia or gonorrhea. Ask your health care provider if you are at risk.  Ask your health care provider about whether you are at high risk for HIV. Your health care provider may recommend a prescription medicine to help prevent HIV infection. If you choose to take medicine to prevent HIV, you should first get tested for HIV. You should  then be tested every 3 months for as long as you are taking the medicine. Pregnancy  If you are about to stop having your period (premenopausal) and you may become pregnant, seek counseling before you get pregnant.  Take 400 to 800 micrograms (mcg) of folic acid every day if you become pregnant.  Ask for birth control (contraception) if you want to prevent pregnancy. Osteoporosis and menopause Osteoporosis is a disease in which the bones lose minerals and strength with aging. This can result in bone fractures. If you are 89 years old or older, or if you are at risk for osteoporosis and fractures, ask your health care provider if you should:  Be screened for bone loss.  Take a calcium or vitamin D supplement to lower your risk of fractures.  Be given hormone replacement therapy (HRT) to treat symptoms of menopause. Follow these instructions at home: Lifestyle  Do not use any products that contain nicotine or tobacco, such as cigarettes, e-cigarettes, and chewing tobacco. If you need help quitting, ask your health care provider.  Do not use street drugs.  Do not share needles.  Ask your health care provider for help if you need support or information about quitting drugs. Alcohol use  Do not drink alcohol if: ? Your health care provider tells you not to drink. ? You are pregnant, may be pregnant, or are planning to become pregnant.  If you drink alcohol: ? Limit how much you use to 0-1 drink a day. ? Limit intake if you are breastfeeding.  Be aware of how much alcohol is in your drink. In the [place] one drink equals one 12 oz bottle of beer (355 mL), one 5 oz glass of wine (148 mL), or one 1 oz glass of hard liquor (44 mL). General instructions  Schedule regular health, dental, and eye exams.  Stay current with your vaccines.  Tell your health care provider if: ? You often feel depressed. ? You have ever been abused or do not feel safe at home. Summary  Adopting a  healthy lifestyle and getting preventive care are important in promoting health and wellness.  Follow your health care provider's instructions about healthy diet, exercising, and getting tested or screened for diseases.  Follow your health care provider's instructions on monitoring your cholesterol and blood pressure. This information is not intended to replace advice given to you by your health care provider. Make sure you discuss any questions you have with your health care provider. Document Revised: 07/03/2018 Document Reviewed: 07/03/2018 Elsevier Patient Education  2021 [employer].

## 2020-09-18 NOTE — Progress Notes (Addendum)
[facility] [address], [place], [place] [address] [phone]  Date:  09/20/2020   Name:  Patricia Rush   DOB:  1955/11/17   MRN:  [id number]  PCP:  [name], MD    Chief Complaint: Annual Exam   History of Present Illness:  Patricia Rush is a 65 y.o. very pleasant female patient who presents with the following:  Here today for a CPE.  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 Coronary artery disease multiple lesions detected on coronary CT angio, but none of this appears to be hemodynamically significant.  LAD proximal portion got FFR 0.92, midportion of 0.89, distal portion of 0.82.  Left circumflex have proximal portion 0.94, RCA proximal 0.92, mid 0.89, distal 0.86.  None of this is hemodynamically significant we will continue conservative approach. 2. Essential hypertension blood pressure well controlled continue present management. 3. Dyslipidemia: Last fasting lipid profile reviewed, she is on high intensity statin which I will continue.  Her LDL was 53, HDL 46, triglycerides 129.  Does have excellent cholesterol profile.  We will continue present management. 4. Atypical chest pain in a rare occasions while she is in hot tub.  I told her to let me know if this became more frequent   No CP, no SOB No PMB Patient Active Problem List   Diagnosis Date Noted  . Seasonal allergies   . [place] measles   . Chicken pox   . Coronary artery disease 12/12/2019  . Atypical chest pain 11/12/2019  .  Dyslipidemia 11/12/2019  . Essential hypertension 11/12/2019  . Borderline blood pressure 03/17/2019    Past Medical History:  Diagnosis Date  . Atypical chest pain 11/12/2019  . Borderline blood pressure 03/17/2019  . Chicken pox   . Coronary artery disease 12/12/2019  . Dyslipidemia 11/12/2019  . Essential hypertension 11/12/2019  . [place] measles   . Seasonal allergies     Past Surgical History:  Procedure Laterality Date  . AUGMENTATION MAMMAPLASTY    . MANDIBLE SURGERY    . TONSILLECTOMY      Social History   Tobacco Use  . Smoking status: Never Smoker  . Smokeless tobacco: Never Used  Substance Use Topics  . Alcohol use: Never  . Drug use: Never    Family History  Problem Relation Age of Onset  . Alzheimer's disease Father     No Known Allergies  Medication list has been reviewed and updated.  Current Outpatient Medications on File Prior to Visit  Medication Sig Dispense Refill  . aspirin EC 81 MG tablet Take 81 mg by mouth daily.    . Cholecalciferol (VITAMIN D) 50 MCG (2000 UT) CAPS Take 2,000 Units by mouth.    . nitroGLYCERIN (NITROSTAT) 0.4 MG SL tablet Place 1 tablet (0.4 mg total) under the tongue every 5 (five) minutes as needed for chest pain. 25 tablet 3   No current facility-administered medications on file prior to visit.  Review of Systems:  As per HPI- otherwise negative.   Physical Examination: Vitals:   09/20/20 1313  BP: 118/72  Pulse: 61  Resp: 16  Temp: 98.5 F (36.9 C)  SpO2: 99%   Vitals:   09/20/20 1313  Weight: 165 lb (74.8 kg)  Height: 5\' 6"  (1.676 m)   Body mass index is 26.63 kg/m. Ideal Body Weight: Weight in (lb) to have BMI = 25: 154.6  GEN: no acute distress.  Mild overweight, looks well HEENT: Atraumatic, Normocephalic.   Bilateral TM wnl, oropharynx normal.  PEERL,EOMI.   Ears and Nose: No external deformity. CV: RRR, No M/G/R. No JVD. No thrill. No extra heart sounds. PULM: CTA B, no wheezes, crackles,  rhonchi. No retractions. No resp. distress. No accessory muscle use. ABD: S, NT, ND, +BS. No rebound. No HSM. EXTR: No c/c/e PSYCH: Normally interactive. Conversant.    Assessment and Plan: Physical exam  Essential hypertension - Plan: lisinopril (ZESTRIL) 10 MG tablet  Vitamin D deficiency - Plan: DG Bone Density  Estrogen deficiency - Plan: VITAMIN D 25 Hydroxy (Vit-D Deficiency, Fractures)  Screening for hyperlipidemia - Plan: Lipid panel, atorvastatin (LIPITOR) 80 MG tablet  Screening for thyroid disorder - Plan: TSH  Patient seen today for a physical exam.  Her blood pressures under good control Ordered bone density and routine labs as above Refilled medications Encouraged healthy diet and exercise routine Will plan further follow- up pending labs.  This visit occurred during the SARS-CoV-2 public health emergency.  Safety protocols were in place, including screening questions prior to the visit, additional usage of staff PPE, and extensive cleaning of exam room while observing appropriate contact time as indicated for disinfecting solutions.    Signed Lamar Blinks, MD  Received her labs as follows 3/1-  Results for orders placed or performed in visit on 09/20/20  Lipid panel  Result Value Ref Range   Cholesterol 149 0 - 200 mg/dL   Triglycerides 140.0 0.0 - 149.0 mg/dL   HDL 52.60 >39.00 mg/dL   VLDL 28.0 0.0 - 40.0 mg/dL   LDL Cholesterol 69 0 - 99 mg/dL   Total CHOL/HDL Ratio 3    NonHDL 96.71   VITAMIN D 25 Hydroxy (Vit-D Deficiency, Fractures)  Result Value Ref Range   VITD 29.78 (L) 30.00 - 100.00 ng/mL  TSH  Result Value Ref Range   TSH 1.93 0.35 - 4.50 uIU/mL   Message to pt

## 2020-09-20 ENCOUNTER — Other Ambulatory Visit (HOSPITAL_BASED_OUTPATIENT_CLINIC_OR_DEPARTMENT_OTHER): Payer: Self-pay | Admitting: Family Medicine

## 2020-09-20 ENCOUNTER — Encounter: Payer: Self-pay | Admitting: Family Medicine

## 2020-09-20 ENCOUNTER — Ambulatory Visit (INDEPENDENT_AMBULATORY_CARE_PROVIDER_SITE_OTHER): Payer: 59 | Admitting: Family Medicine

## 2020-09-20 ENCOUNTER — Other Ambulatory Visit: Payer: Self-pay

## 2020-09-20 ENCOUNTER — Other Ambulatory Visit: Payer: Self-pay | Admitting: Family Medicine

## 2020-09-20 VITALS — BP 118/72 | HR 61 | Temp 98.5°F | Resp 16 | Ht 66.0 in | Wt 165.0 lb

## 2020-09-20 DIAGNOSIS — Z0000 Encounter for general adult medical examination without abnormal findings: Secondary | ICD-10-CM | POA: Diagnosis not present

## 2020-09-20 DIAGNOSIS — Z13220 Encounter for screening for lipoid disorders: Secondary | ICD-10-CM

## 2020-09-20 DIAGNOSIS — E559 Vitamin D deficiency, unspecified: Secondary | ICD-10-CM | POA: Diagnosis not present

## 2020-09-20 DIAGNOSIS — Z1329 Encounter for screening for other suspected endocrine disorder: Secondary | ICD-10-CM

## 2020-09-20 DIAGNOSIS — I10 Essential (primary) hypertension: Secondary | ICD-10-CM

## 2020-09-20 DIAGNOSIS — E2839 Other primary ovarian failure: Secondary | ICD-10-CM | POA: Diagnosis not present

## 2020-09-20 DIAGNOSIS — Z1231 Encounter for screening mammogram for malignant neoplasm of breast: Secondary | ICD-10-CM

## 2020-09-20 MED ORDER — LISINOPRIL 10 MG PO TABS: 10.0000 mg | ORAL_TABLET | Freq: Every day | ORAL | 3 refills | Status: DC

## 2020-09-20 MED ORDER — ATORVASTATIN CALCIUM 80 MG PO TABS: 80.0000 mg | ORAL_TABLET | Freq: Every day | ORAL | 3 refills | Status: DC

## 2020-09-21 ENCOUNTER — Encounter: Payer: Self-pay | Admitting: Family Medicine

## 2020-09-21 LAB — LIPID PANEL
Cholesterol: 149 mg/dL (ref 0–200)
HDL: 52.6 mg/dL (ref >39.00)
LDL Cholesterol: 69 mg/dL (ref 0–99)
NonHDL: 96.71
Total CHOL/HDL Ratio: 3
Triglycerides: 140 mg/dL (ref 0.0–149.0)
VLDL: 28 mg/dL (ref 0.0–40.0)

## 2020-09-21 LAB — TSH: TSH: 1.93 u[IU]/mL (ref 0.35–4.50)

## 2020-09-21 LAB — VITAMIN D 25 HYDROXY (VIT D DEFICIENCY, FRACTURES): VITD: 29.78 ng/mL — ABNORMAL LOW (ref 30.00–100.00)

## 2020-09-27 ENCOUNTER — Ambulatory Visit: Payer: 59 | Admitting: Cardiology

## 2020-09-27 ENCOUNTER — Encounter: Payer: Self-pay | Admitting: Cardiology

## 2020-09-27 ENCOUNTER — Other Ambulatory Visit: Payer: Self-pay

## 2020-09-27 VITALS — BP 130/76 | HR 94 | Ht 66.0 in | Wt 166.0 lb

## 2020-09-27 DIAGNOSIS — E785 Hyperlipidemia, unspecified: Secondary | ICD-10-CM

## 2020-09-27 DIAGNOSIS — I2510 Atherosclerotic heart disease of native coronary artery without angina pectoris: Secondary | ICD-10-CM

## 2020-09-27 DIAGNOSIS — I10 Essential (primary) hypertension: Secondary | ICD-10-CM | POA: Diagnosis not present

## 2020-09-27 NOTE — Patient Instructions (Signed)

## 2020-09-27 NOTE — Progress Notes (Signed)
Cardiology Office Note:    Date:  09/27/2020   ID:  Patricia Rush, DOB 02/24/1956, MRN [id number]  PCP:  [name], MD  Cardiologist:  [name], MD    Referring MD: [name], MD   No chief complaint on file. I am doing fine, I am asymptomatic.  History of Present Illness:    Patricia Rush is a 65 y.o. female with past medical history significant for coronary artery disease, she did have coronary CT angio in December of last year showing multiple by hemodynamically insignificant lesion.  Her past medical history is also significant for essential hypertension as well as  Dyslipidemia. She comes today 2 months for follow-up overall she is doing great she is asymptomatic still being active and exercising on the regular basis.  The only situation she got chest pain before was 1 time when she was in a hot tub.  She is going to be hyped up and have no difficulty having any chest pain.  Few months ago she ended up having COVID-19 vaccine after that the next day she was nauseated and eventually she was throwing up she passed out.  She ended up going to the emergency room she was told to have vagal reaction which is probably correct. She is trying to be active she is working second shift right now so after my visit she is fine to go for long walk.  Past Medical History:  Diagnosis Date  . Atypical chest pain 11/12/2019  . Borderline blood pressure 03/17/2019  . Chicken pox   . Coronary artery disease 12/12/2019  . Dyslipidemia 11/12/2019  . Essential hypertension 11/12/2019  . [place] measles   . Seasonal allergies     Past Surgical History:  Procedure Laterality Date  . AUGMENTATION MAMMAPLASTY    . MANDIBLE SURGERY    . TONSILLECTOMY      Current Medications: Current Meds  Medication Sig  . aspirin EC 81 MG tablet Take 81 mg by mouth daily.  Marland Kitchen atorvastatin (LIPITOR) 80 MG tablet Take 1 tablet (80 mg total) by mouth daily.  . Cholecalciferol (VITAMIN D) 50 MCG  (2000 UT) CAPS Take 2,000 Units by mouth.  Marland Kitchen lisinopril (ZESTRIL) 10 MG tablet Take 1 tablet (10 mg total) by mouth daily.  . nitroGLYCERIN (NITROSTAT) 0.4 MG SL tablet Place 1 tablet (0.4 mg total) under the tongue every 5 (five) minutes as needed for chest pain.     Allergies:   Patient has no known allergies.   Social History   Socioeconomic History  . Marital status: Married    Spouse name: Not on file  . Number of children: Not on file  . Years of education: Not on file  . Highest education level: Not on file  Occupational History  . Not on file  Tobacco Use  . Smoking status: Never Smoker  . Smokeless tobacco: Never Used  Substance and Sexual Activity  . Alcohol use: Never  . Drug use: Never  . Sexual activity: Yes    Birth control/protection: None  Other Topics Concern  . Not on file  Social History Narrative  . Not on file   Social Determinants of Health   Financial Resource Strain: Not on file  Food Insecurity: Not on file  Transportation Needs: Not on file  Physical Activity: Not on file  Stress: Not on file  Social Connections: Not on file     Family History: The patient's family history includes Alzheimer's disease in her  father. ROS:   Please see the history of present illness.    All 14 point review of systems negative except as described per history of present illness  EKGs/Labs/Other Studies Reviewed:      Recent Labs: 01/27/2020: ALT 20 04/26/2020: BUN 16; Creatinine, Ser 0.76; Hemoglobin 14.1; Platelets 213; Potassium 3.4; Sodium 134 09/20/2020: TSH 1.93  Recent Lipid Panel    Component Value Date/Time   CHOL 149 09/20/2020 1340   CHOL 122 03/19/2020 1542   TRIG 140.0 09/20/2020 1340   HDL 52.60 09/20/2020 1340   HDL 46 03/19/2020 1542   CHOLHDL 3 09/20/2020 1340   VLDL 28.0 09/20/2020 1340   LDLCALC 69 09/20/2020 1340   LDLCALC 53 03/19/2020 1542    Physical Exam:    VS:  BP 130/76 (BP Location: Right Arm, Patient Position: Sitting)    Pulse 94   Ht 5\' 6"  (1.676 m)   Wt 166 lb (75.3 kg)   SpO2 96%   BMI 26.79 kg/m     Wt Readings from Last 3 Encounters:  09/27/20 166 lb (75.3 kg)  09/20/20 165 lb (74.8 kg)  05/24/20 169 lb (76.7 kg)     GEN:  Well nourished, well developed in no acute distress HEENT: Normal NECK: No JVD; No carotid bruits LYMPHATICS: No lymphadenopathy CARDIAC: RRR, no murmurs, no rubs, no gallops RESPIRATORY:  Clear to auscultation without rales, wheezing or rhonchi  ABDOMEN: Soft, non-tender, non-distended MUSCULOSKELETAL:  No edema; No deformity  SKIN: Warm and dry LOWER EXTREMITIES: no swelling NEUROLOGIC:  Alert and oriented x 3 PSYCHIATRIC:  Normal affect   ASSESSMENT:    1. Coronary artery disease involving native coronary artery of native heart without angina pectoris   2. Essential hypertension   3. Dyslipidemia    PLAN:    In order of problems listed above:  1. Coronary artery disease with coronary CT angio showing proximal portion of LAD with FFR of 0.92, midportion 0.89 distal portion of 0.82.,  Circumflex had proximal 0.94, RCA proximal 0.92 mid 0.89, distal 0.86.  None of this appears to be hemodynamically significant, she is asymptomatic.  We will continue risk factors modifications.  We did talk about healthy lifestyle need to exercise on the regular basis which she does, she is already on aspirin as well as statin which I will continue. 2. Essential hypertension well-controlled continue present management. 3. Dyslipidemia, stable.  I did review her K PN from 09/20/2020 showing LDL of 69 HDL 52 and again this is on high intensity statin form of Lipitor 80 which I will continue.   Medication Adjustments/Labs and Tests Ordered: Current medicines are reviewed at length with the patient today.  Concerns regarding medicines are outlined above.  No orders of the defined types were placed in this encounter.  Medication changes: No orders of the defined types were placed in  this encounter.   Signed, Park Liter, MD, [facility] 09/27/2020 8:23 AM    [place]

## 2020-09-30 ENCOUNTER — Ambulatory Visit (HOSPITAL_BASED_OUTPATIENT_CLINIC_OR_DEPARTMENT_OTHER)
Admission: RE | Admit: 2020-09-30 | Discharge: 2020-09-30 | Disposition: A | Discharge status: Home / Self Care | Payer: 59 | Source: Ambulatory Visit | Attending: Family Medicine | Admitting: Family Medicine

## 2020-09-30 ENCOUNTER — Encounter: Payer: Self-pay | Admitting: Family Medicine

## 2020-09-30 ENCOUNTER — Other Ambulatory Visit: Payer: Self-pay

## 2020-09-30 ENCOUNTER — Other Ambulatory Visit (HOSPITAL_BASED_OUTPATIENT_CLINIC_OR_DEPARTMENT_OTHER): Payer: Self-pay | Admitting: Family Medicine

## 2020-09-30 DIAGNOSIS — Z1231 Encounter for screening mammogram for malignant neoplasm of breast: Secondary | ICD-10-CM

## 2020-09-30 DIAGNOSIS — E559 Vitamin D deficiency, unspecified: Secondary | ICD-10-CM | POA: Insufficient documentation

## 2020-09-30 DIAGNOSIS — M810 Age-related osteoporosis without current pathological fracture: Secondary | ICD-10-CM | POA: Diagnosis not present

## 2020-09-30 DIAGNOSIS — Z780 Asymptomatic menopausal state: Secondary | ICD-10-CM | POA: Diagnosis not present

## 2020-10-05 ENCOUNTER — Other Ambulatory Visit: Payer: Self-pay | Admitting: Family Medicine

## 2020-10-05 DIAGNOSIS — R928 Other abnormal and inconclusive findings on diagnostic imaging of breast: Secondary | ICD-10-CM

## 2020-10-07 ENCOUNTER — Encounter: Payer: Self-pay | Admitting: Family Medicine

## 2020-10-19 ENCOUNTER — Other Ambulatory Visit: Payer: Self-pay | Admitting: Family Medicine

## 2020-10-19 ENCOUNTER — Ambulatory Visit
Admission: RE | Admit: 2020-10-19 | Discharge: 2020-10-19 | Disposition: A | Discharge status: Home / Self Care | Payer: 59 | Source: Ambulatory Visit | Attending: Family Medicine | Admitting: Family Medicine

## 2020-10-19 ENCOUNTER — Other Ambulatory Visit: Payer: Self-pay

## 2020-10-19 DIAGNOSIS — R928 Other abnormal and inconclusive findings on diagnostic imaging of breast: Secondary | ICD-10-CM

## 2020-10-19 DIAGNOSIS — N6311 Unspecified lump in the right breast, upper outer quadrant: Secondary | ICD-10-CM | POA: Diagnosis not present

## 2020-10-19 DIAGNOSIS — N63 Unspecified lump in unspecified breast: Secondary | ICD-10-CM

## 2020-10-19 DIAGNOSIS — R922 Inconclusive mammogram: Secondary | ICD-10-CM | POA: Diagnosis not present

## 2020-10-22 ENCOUNTER — Ambulatory Visit: Payer: 59

## 2020-10-28 ENCOUNTER — Telehealth: Payer: Self-pay

## 2020-10-28 NOTE — Telephone Encounter (Signed)
Clinical info submitted to insurance company. Waiting on summary of benefits to determine coverage. Will call patient to discuss once received.  

## 2020-11-12 ENCOUNTER — Ambulatory Visit: Payer: 59 | Attending: Internal Medicine

## 2020-11-12 DIAGNOSIS — Z23 Encounter for immunization: Secondary | ICD-10-CM

## 2020-11-12 NOTE — Progress Notes (Signed)
   Covid-19 Vaccination Clinic  Name:  Patricia Rush    MRN: [id number] DOB: 05/21/1956  11/12/2020  Patricia Rush was observed post Covid-19 immunization for 15 minutes without incident. She was provided with Vaccine Information Sheet and instruction to access the V-Safe system.   Patricia Rush was instructed to call 911 with any severe reactions post vaccine: Marland Kitchen Difficulty breathing  . Swelling of face and throat  . A fast heartbeat  . A bad rash all over body  . Dizziness and weakness   Immunizations Administered    Name Date Dose VIS Date Route   Moderna Covid-19 Booster Vaccine 11/12/2020  9:34 AM 0.25 mL 05/12/2020 Intramuscular   Manufacturer: Moderna   Lot: [id number]   [place]: [id number]

## 2020-11-15 ENCOUNTER — Other Ambulatory Visit (HOSPITAL_BASED_OUTPATIENT_CLINIC_OR_DEPARTMENT_OTHER): Payer: Self-pay

## 2020-11-15 MED ORDER — MODERNA COVID-19 VACCINE 100 MCG/0.5ML IM SUSP
INTRAMUSCULAR | 0 refills | Status: DC
  Filled 2020-11-15: qty 0.25, 1d supply, fill #0

## 2020-11-22 ENCOUNTER — Ambulatory Visit: Payer: 59 | Admitting: Family Medicine

## 2020-12-29 ENCOUNTER — Other Ambulatory Visit: Payer: Self-pay

## 2020-12-29 ENCOUNTER — Encounter: Payer: Self-pay | Admitting: Emergency Medicine

## 2020-12-29 ENCOUNTER — Emergency Department
Admission: EM | Admit: 2020-12-29 | Discharge: 2020-12-29 | Disposition: A | Discharge status: Home / Self Care | Payer: 59 | Source: Home / Self Care | Attending: Internal Medicine | Admitting: Internal Medicine

## 2020-12-29 DIAGNOSIS — J029 Acute pharyngitis, unspecified: Secondary | ICD-10-CM

## 2020-12-29 NOTE — ED Provider Notes (Signed)
[facility]    CSN: [id number] Arrival date & time: 12/29/20  1424      History   Chief Complaint Chief Complaint  Patient presents with  . Sore Throat    HPI Patricia Rush is a 65 y.o. female to the urgent care with a 1 week history of sore throat.  Patient recently attended a wedding in [place].  Symptoms started after she returned from [place].  She denies any fever, cough or sputum production.  She has pain with swallowing.  No nausea, vomiting or diarrhea.  Patient was exposed to COVID-19 positive individuals in [place] about a week ago.  Patient is fully vaccinated and boosted against COVID-19 virus.  No diarrhea.   HPI  Past Medical History:  Diagnosis Date  . Atypical chest pain 11/12/2019  . Borderline blood pressure 03/17/2019  . Chicken pox   . Coronary artery disease 12/12/2019  . Dyslipidemia 11/12/2019  . Essential hypertension 11/12/2019  . [place] measles   . Seasonal allergies     Patient Active Problem List   Diagnosis Date Noted  . Osteoporosis 09/30/2020  . Seasonal allergies   . [place] measles   . Chicken pox   . Coronary artery disease 12/12/2019  . Atypical chest pain 11/12/2019  . Dyslipidemia 11/12/2019  . Essential hypertension 11/12/2019  . Borderline blood pressure 03/17/2019    Past Surgical History:  Procedure Laterality Date  . AUGMENTATION MAMMAPLASTY    . MANDIBLE SURGERY    . TONSILLECTOMY      OB History    Gravida  1   Para  1   Term  1   Preterm      AB      Living  1     SAB      IAB      Ectopic      Multiple      Live Births  1            Home Medications    Prior to Admission medications   Medication Sig Start Date End Date Taking? Authorizing Provider  aspirin EC 81 MG tablet Take 81 mg by mouth daily.   Yes [provider]  atorvastatin (LIPITOR) 80 MG tablet TAKE 1 TABLET BY MOUTH DAILY 09/20/20 09/20/21 Yes Copland, Gay Filler, MD  Cholecalciferol (VITAMIN D) 50 MCG  (2000 UT) CAPS Take 2,000 Units by mouth.   Yes [provider]  lisinopril (ZESTRIL) 10 MG tablet TAKE 1 TABLET BY MOUTH ONCE A DAY 09/20/20 09/20/21 Yes Copland, Gay Filler, MD  COVID-19 mRNA vaccine, Moderna, (MODERNA COVID-19 VACCINE) 100 MCG/0.5ML injection Inject into the muscle. 11/12/20   Carlyle Basques, MD  nitroGLYCERIN (NITROSTAT) 0.4 MG SL tablet Place 1 tablet (0.4 mg total) under the tongue every 5 (five) minutes as needed for chest pain. 11/12/19 03/22/20  Park Liter, MD  ondansetron (ZOFRAN-ODT) 4 MG disintegrating tablet DISSOLVE 1 TABLET (4 MG TOTAL) BY MOUTH EVERY 8 (EIGHT) HOURS AS NEEDED FOR UP TO 3 DAYS FOR NAUSEA OR VOMITING. Patient not taking: Reported on 12/29/2020 04/27/20 04/27/21  Fatima Blank, MD    Family History Family History  Problem Relation Age of Onset  . Alzheimer's disease Father     Social History Social History   Tobacco Use  . Smoking status: Never Smoker  . Smokeless tobacco: Never Used  Substance Use Topics  . Alcohol use: Never  . Drug use: Never     Allergies   Patient  has no known allergies.   Review of Systems Review of Systems  Constitutional: Negative.   HENT: Positive for ear pain and sore throat. Negative for ear discharge, sinus pressure and sinus pain.   Musculoskeletal: Negative for arthralgias and myalgias.  Neurological: Positive for headaches.     Physical Exam Triage Vital Signs ED Triage Vitals  Enc Vitals Group     BP 12/29/20 1443 128/78     Pulse Rate 12/29/20 1443 88     Resp 12/29/20 1443 15     Temp 12/29/20 1443 98.7 F (37.1 C)     Temp Source 12/29/20 1443 Oral     SpO2 12/29/20 1443 97 %     Weight 12/29/20 1447 160 lb (72.6 kg)     Height 12/29/20 1447 5\' 6"  (1.676 m)     Head Circumference --      Peak Flow --      Pain Score 12/29/20 1447 4     Pain Loc --      Pain Edu? --      Excl. in [place]? --    No data found.  Updated Vital Signs BP 128/78 (BP Location: Right  Arm)   Pulse 88   Temp 98.7 F (37.1 C) (Oral)   Resp 15   Ht 5\' 6"  (1.676 m)   Wt 72.6 kg   SpO2 97%   BMI 25.82 kg/m   Visual Acuity Right Eye Distance:   Left Eye Distance:   Bilateral Distance:    Right Eye Near:   Left Eye Near:    Bilateral Near:     Physical Exam Vitals and nursing note reviewed.  Constitutional:      General: She is not in acute distress.    Appearance: She is not ill-appearing.  HENT:     Right Ear: Tympanic membrane normal.     Left Ear: Tympanic membrane normal.     Mouth/Throat:     Mouth: Mucous membranes are moist.     Pharynx: Posterior oropharyngeal erythema present. No pharyngeal swelling.     Tonsils: No tonsillar exudate or tonsillar abscesses.  Cardiovascular:     Rate and Rhythm: Normal rate and regular rhythm.  Neurological:     Mental Status: She is alert.      UC Treatments / Results  Labs (all labs ordered are listed, but only abnormal results are displayed) Labs Reviewed - No data to display  EKG   Radiology No results found.  Procedures Procedures (including critical care time)  Medications Ordered in UC Medications - No data to display  Initial Impression / Assessment and Plan / UC Course  I have reviewed the triage vital signs and the nursing notes.  Pertinent labs & imaging results that were available during my care of the patient were reviewed by me and considered in my medical decision making (see chart for details).     1.  Acute viral pharyngitis: Warm salt water gargle Given the negative home COVID test, I doubt this is COVID-related This is most likely viral pharyngitis Supportive care by using warm salt water gargle and Chloraseptic throat spray Increase oral fluid intake Return to urgent care if symptoms worsen. Final Clinical Impressions(s) / UC Diagnoses   Final diagnoses:  Viral pharyngitis     Discharge Instructions     Chloraseptic throat spray Warm salt water gargles Tylenol  or Motrin as needed for pain    ED Prescriptions    None  PDMP not reviewed this encounter.   Chase Picket, MD 12/29/20 [phone]

## 2020-12-29 NOTE — Discharge Instructions (Signed)
Chloraseptic throat spray Warm salt water gargles Tylenol or Motrin as needed for pain

## 2020-12-29 NOTE — ED Triage Notes (Signed)
Sore throat x 1 week  Was in [place]  This past week for her daughter's wedding  COVID exposure  Home test for COVID was negative on Monday night  Denies fever Ibuprofen 600 mg daily

## 2021-02-11 ENCOUNTER — Other Ambulatory Visit (HOSPITAL_COMMUNITY): Payer: Self-pay

## 2021-02-11 MED FILL — Lisinopril Tab 10 MG: ORAL | 90 days supply | Qty: 90 | Fill #0 | Status: AC

## 2021-02-11 MED FILL — Atorvastatin Calcium Tab 80 MG (Base Equivalent): ORAL | 90 days supply | Qty: 90 | Fill #0 | Status: AC

## 2021-04-06 ENCOUNTER — Ambulatory Visit
Admission: RE | Admit: 2021-04-06 | Discharge: 2021-04-06 | Disposition: A | Discharge status: Home / Self Care | Payer: 59 | Source: Ambulatory Visit | Attending: Family Medicine | Admitting: Family Medicine

## 2021-04-06 ENCOUNTER — Encounter: Payer: Self-pay | Admitting: Cardiology

## 2021-04-06 ENCOUNTER — Other Ambulatory Visit (HOSPITAL_COMMUNITY): Payer: Self-pay

## 2021-04-06 ENCOUNTER — Ambulatory Visit: Payer: 59 | Admitting: Cardiology

## 2021-04-06 ENCOUNTER — Other Ambulatory Visit: Payer: Self-pay

## 2021-04-06 ENCOUNTER — Other Ambulatory Visit: Payer: Self-pay | Admitting: Family Medicine

## 2021-04-06 VITALS — BP 140/70 | HR 83 | Ht 66.0 in | Wt 164.0 lb

## 2021-04-06 DIAGNOSIS — N6311 Unspecified lump in the right breast, upper outer quadrant: Secondary | ICD-10-CM | POA: Diagnosis not present

## 2021-04-06 DIAGNOSIS — N63 Unspecified lump in unspecified breast: Secondary | ICD-10-CM

## 2021-04-06 DIAGNOSIS — I10 Essential (primary) hypertension: Secondary | ICD-10-CM

## 2021-04-06 DIAGNOSIS — E785 Hyperlipidemia, unspecified: Secondary | ICD-10-CM | POA: Diagnosis not present

## 2021-04-06 DIAGNOSIS — I2510 Atherosclerotic heart disease of native coronary artery without angina pectoris: Secondary | ICD-10-CM

## 2021-04-06 DIAGNOSIS — Z79899 Other long term (current) drug therapy: Secondary | ICD-10-CM | POA: Diagnosis not present

## 2021-04-06 MED ORDER — LISINOPRIL 20 MG PO TABS
20.0000 mg | ORAL_TABLET | Freq: Every day | ORAL | 3 refills | Status: DC
  Filled 2021-04-06: qty 90, 90d supply, fill #0
  Filled 2021-09-27: qty 90, 90d supply, fill #1
  Filled 2022-02-06: qty 90, 90d supply, fill #2

## 2021-04-06 NOTE — Progress Notes (Signed)
Cardiology Office Note:    Date:  04/06/2021   ID:  Patricia Rush, DOB 10-Jun-1956, MRN [id number]  PCP:  [name], MD  Cardiologist:  [name], MD    Referring MD: [name], MD   Chief Complaint  Patient presents with   Follow-up  Am doing fine  History of Present Illness:    Patricia Rush is a 65 y.o. female with past medical history significant for coronary artery disease.  She did have coronary CT angio done in December 2020 which showed multiple likely hemodynamically insignificant lesions.  The key is risk factors modifications.  She also got dyslipidemia as well as essential hypertension.  She comes today to my office for follow-up.  Overall she is doing very well.  There is no chest pain tightness squeezing pressure burning chest.  She used to exercise on the regular basis until about a month ago month ago she started having come with her mother apparently her mother was taking too much trazodone and it was a problem.  On top of that her husband is sick he required angioplasty therefore she did not have time to do that but otherwise seems to be doing well.  Past Medical History:  Diagnosis Date   Atypical chest pain 11/12/2019   Borderline blood pressure 03/17/2019   Chicken pox    Coronary artery disease 12/12/2019   Dyslipidemia 11/12/2019   Essential hypertension 11/12/2019   [place] measles    Seasonal allergies     Past Surgical History:  Procedure Laterality Date   AUGMENTATION MAMMAPLASTY     MANDIBLE SURGERY     TONSILLECTOMY      Current Medications: Current Meds  Medication Sig   aspirin EC 81 MG tablet Take 81 mg by mouth daily.   atorvastatin (LIPITOR) 80 MG tablet TAKE 1 TABLET BY MOUTH DAILY (Patient taking differently: Take 80 mg by mouth daily.)   Calcium Carbonate (CALCIUM 500 PO) Take 1,000 mg by mouth daily.   Cholecalciferol (VITAMIN D) 50 MCG (2000 UT) CAPS Take 2,000 Units by mouth daily.   COVID-19 mRNA vaccine,  Moderna, (MODERNA COVID-19 VACCINE) 100 MCG/0.5ML injection Inject into the muscle. (Patient taking differently: Inject 0.5 mLs into the muscle once.)   lisinopril (ZESTRIL) 10 MG tablet TAKE 1 TABLET BY MOUTH ONCE A DAY (Patient taking differently: Take 10 mg by mouth daily.)   nitroGLYCERIN (NITROSTAT) 0.4 MG SL tablet Place 1 tablet (0.4 mg total) under the tongue every 5 (five) minutes as needed for chest pain.     Allergies:   Patient has no known allergies.   Social History   Socioeconomic History   Marital status: Married    Spouse name: Not on file   Number of children: Not on file   Years of education: Not on file   Highest education level: Not on file  Occupational History   Not on file  Tobacco Use   Smoking status: Never   Smokeless tobacco: Never  Substance and Sexual Activity   Alcohol use: Never   Drug use: Never   Sexual activity: Yes    Birth control/protection: None  Other Topics Concern   Not on file  Social History Narrative   Not on file   Social Determinants of Health   Financial Resource Strain: Not on file  Food Insecurity: Not on file  Transportation Needs: Not on file  Physical Activity: Not on file  Stress: Not on file  Social Connections: Not on file  Family History: The patient's family history includes Alzheimer's disease in her father. ROS:   Please see the history of present illness.    All 14 point review of systems negative except as described per history of present illness  EKGs/Labs/Other Studies Reviewed:      Recent Labs: 04/26/2020: BUN 16; Creatinine, Ser 0.76; Hemoglobin 14.1; Platelets 213; Potassium 3.4; Sodium 134 09/20/2020: TSH 1.93  Recent Lipid Panel    Component Value Date/Time   CHOL 149 09/20/2020 1340   CHOL 122 03/19/2020 1542   TRIG 140.0 09/20/2020 1340   HDL 52.60 09/20/2020 1340   HDL 46 03/19/2020 1542   CHOLHDL 3 09/20/2020 1340   VLDL 28.0 09/20/2020 1340   LDLCALC 69 09/20/2020 1340   LDLCALC  53 03/19/2020 1542    Physical Exam:    VS:  BP 140/70 (BP Location: Left Arm, Patient Position: Sitting)   Pulse 83   Ht '5\' 6"'$  (1.676 m)   Wt 164 lb (74.4 kg)   SpO2 99%   BMI 26.47 kg/m     Wt Readings from Last 3 Encounters:  04/06/21 164 lb (74.4 kg)  12/29/20 160 lb (72.6 kg)  09/27/20 166 lb (75.3 kg)     GEN:  Well nourished, well developed in no acute distress HEENT: Normal NECK: No JVD; No carotid bruits LYMPHATICS: No lymphadenopathy CARDIAC: RRR, no murmurs, no rubs, no gallops RESPIRATORY:  Clear to auscultation without rales, wheezing or rhonchi  ABDOMEN: Soft, non-tender, non-distended MUSCULOSKELETAL:  No edema; No deformity  SKIN: Warm and dry LOWER EXTREMITIES: no swelling NEUROLOGIC:  Alert and oriented x 3 PSYCHIATRIC:  Normal affect   ASSESSMENT:    1. Coronary artery disease involving native coronary artery of native heart without angina pectoris   2. Essential hypertension   3. Dyslipidemia    PLAN:    In order of problems listed above:  Coronary artery disease likely asymptomatic he is risk factors modifications.  She is on antiplatelet therapy in form of aspirin which I will continue.   Dyslipidemia I did review K PN which show me LDL 69 HDL 52 but this is from February of this year.  We will recheck her fasting blood profile. Essential hypertension: Uncontrolled.  I will double the dose of lisinopril.  I will check her Chem-7 next week Again we talked about healthy lifestyle need to exercise on the regular basis good eating which she is already doing.   Medication Adjustments/Labs and Tests Ordered: Current medicines are reviewed at length with the patient today.  Concerns regarding medicines are outlined above.  No orders of the defined types were placed in this encounter.  Medication changes: No orders of the defined types were placed in this encounter.   Signed, Park Liter, MD, [facility] 04/06/2021 8:26 AM    [place]

## 2021-04-06 NOTE — Addendum Note (Signed)
Addended by: Orvan July on: 04/06/2021 08:50 AM   Modules accepted: Orders

## 2021-04-06 NOTE — Patient Instructions (Signed)
Medication Instructions:  Your physician has recommended you make the following change in your medication:  INCREASE: Lisinopril 20 mg daily  *If you need a refill on your cardiac medications before your next appointment, please call your pharmacy*   Lab Work: Your physician recommends that you return for lab work in:  Next week: BMET, Lipids - please come fasting If you have labs (blood work) drawn today and your tests are completely normal, you will receive your results only by: [place] (if you have MyChart) OR A paper copy in the mail If you have any lab test that is abnormal or we need to change your treatment, we will call you to review the results.   Testing/Procedures: None   Follow-Up: At [facility], you and your health needs are our priority.  As part of our continuing mission to provide you with exceptional heart care, we have created designated Provider Care Teams.  These Care Teams include your primary Cardiologist (physician) and Advanced Practice Providers (APPs -  Physician Assistants and Nurse Practitioners) who all work together to provide you with the care you need, when you need it.  We recommend signing up for the patient portal called "MyChart".  Sign up information is provided on this After Visit Summary.  MyChart is used to connect with patients for Virtual Visits (Telemedicine).  Patients are able to view lab/test results, encounter notes, upcoming appointments, etc.  Non-urgent messages can be sent to your provider as well.   To learn more about what you can do with MyChart, go to [email or web address].    Your next appointment:   6 month(s)  The format for your next appointment:   In Person  Provider:   [name], MD   Other Instructions

## 2021-04-14 DIAGNOSIS — Z79899 Other long term (current) drug therapy: Secondary | ICD-10-CM | POA: Diagnosis not present

## 2021-04-15 LAB — BASIC METABOLIC PANEL
BUN/Creatinine Ratio: 21 (ref 12–28)
BUN: 17 mg/dL (ref 8–27)
CO2: 22 mmol/L (ref 20–29)
Calcium: 10.5 mg/dL — ABNORMAL HIGH (ref 8.7–10.3)
Chloride: 103 mmol/L (ref 96–106)
Creatinine, Ser: 0.81 mg/dL (ref 0.57–1.00)
Glucose: 97 mg/dL (ref 65–99)
Potassium: 4.5 mmol/L (ref 3.5–5.2)
Sodium: 143 mmol/L (ref 134–144)
eGFR: 82 mL/min/{1.73_m2} (ref >59)

## 2021-04-15 LAB — LIPID PANEL
Chol/HDL Ratio: 2.3 ratio (ref 0.0–4.4)
Cholesterol, Total: 137 mg/dL (ref 100–199)
HDL: 60 mg/dL (ref >39)
LDL Chol Calc (NIH): 65 mg/dL (ref 0–99)
Triglycerides: 56 mg/dL (ref 0–149)
VLDL Cholesterol Cal: 12 mg/dL (ref 5–40)

## 2021-04-25 ENCOUNTER — Ambulatory Visit: Payer: 59 | Attending: Internal Medicine

## 2021-04-25 DIAGNOSIS — Z23 Encounter for immunization: Secondary | ICD-10-CM

## 2021-04-25 NOTE — Progress Notes (Signed)
   Covid-19 Vaccination Clinic  Name:  Patricia Rush    MRN: [id number] DOB: [birth year]  04/25/2021  Patricia Rush was observed post Covid-19 immunization for 15 minutes without incident. She was provided with Vaccine Information Sheet and instruction to access the V-Safe system.   Patricia Rush was instructed to call 911 with any severe reactions post vaccine: Difficulty breathing  Swelling of face and throat  A fast heartbeat  A bad rash all over body  Dizziness and weakness

## 2021-05-03 ENCOUNTER — Other Ambulatory Visit (HOSPITAL_BASED_OUTPATIENT_CLINIC_OR_DEPARTMENT_OTHER): Payer: Self-pay

## 2021-05-03 MED ORDER — COVID-19MRNA BIVAL VACC PFIZER 30 MCG/0.3ML IM SUSP
INTRAMUSCULAR | 0 refills | Status: DC
  Filled 2021-05-03: qty 0.3, 1d supply, fill #0

## 2021-07-10 MED FILL — Atorvastatin Calcium Tab 80 MG (Base Equivalent): ORAL | 90 days supply | Qty: 90 | Fill #1 | Status: AC

## 2021-07-11 ENCOUNTER — Other Ambulatory Visit (HOSPITAL_COMMUNITY): Payer: Self-pay

## 2021-07-26 ENCOUNTER — Telehealth: Payer: Self-pay

## 2021-07-26 NOTE — Telephone Encounter (Signed)
Prolia VOB initiated via [email or web address] ° °Last OV:  °Next OV:  °Last Prolia inj:  °Next Prolia inj DUE: NEW START ° °

## 2021-08-11 NOTE — Telephone Encounter (Signed)
Prior auth required for PROLIA  PA PROCESS DETAILS: Prior Authorization is Required. We are unable to confirm if a PA is on file. Please check your records or contact the payer  

## 2021-08-16 NOTE — Telephone Encounter (Signed)
Approved today The request has been approved. The authorization is effective for a maximum of 2 fills from 08/16/2021 to 08/15/2022, as long as the member is enrolled in their current health plan. The request was approved with a quantity restriction. This has been approved for a quantity limit of 1. A written notification letter will follow with additional detail

## 2021-08-16 NOTE — Telephone Encounter (Signed)
PA started: Case ID: [id number]  Last OV: 08/2020

## 2021-08-17 ENCOUNTER — Encounter: Payer: Self-pay | Admitting: Family Medicine

## 2021-08-27 NOTE — Telephone Encounter (Signed)
Deductible: $350 Max OPP: $7,900

## 2021-09-23 ENCOUNTER — Encounter: Payer: Self-pay | Admitting: Family Medicine

## 2021-09-28 ENCOUNTER — Other Ambulatory Visit (HOSPITAL_COMMUNITY): Payer: Self-pay

## 2021-10-06 ENCOUNTER — Other Ambulatory Visit (HOSPITAL_BASED_OUTPATIENT_CLINIC_OR_DEPARTMENT_OTHER): Payer: Self-pay

## 2021-10-06 ENCOUNTER — Other Ambulatory Visit: Payer: Self-pay

## 2021-10-06 MED ORDER — DENOSUMAB 60 MG/ML ~~LOC~~ SOSY
60.0000 mg | PREFILLED_SYRINGE | Freq: Once | SUBCUTANEOUS | 0 refills | Status: AC
  Filled 2021-10-06: qty 1, 1d supply, fill #0

## 2021-10-06 NOTE — Telephone Encounter (Signed)
Appointment is on 3/22.  ?Rx sent to pharmacy, MyChart sent about new cost.  ?

## 2021-10-08 ENCOUNTER — Other Ambulatory Visit (HOSPITAL_COMMUNITY): Payer: Self-pay

## 2021-10-11 ENCOUNTER — Other Ambulatory Visit: Payer: Self-pay

## 2021-10-11 ENCOUNTER — Encounter: Payer: Self-pay | Admitting: Cardiology

## 2021-10-11 ENCOUNTER — Other Ambulatory Visit (HOSPITAL_BASED_OUTPATIENT_CLINIC_OR_DEPARTMENT_OTHER): Payer: Self-pay

## 2021-10-11 ENCOUNTER — Ambulatory Visit: Payer: 59 | Admitting: Cardiology

## 2021-10-11 ENCOUNTER — Ambulatory Visit
Admission: RE | Admit: 2021-10-11 | Discharge: 2021-10-11 | Disposition: A | Discharge status: Home / Self Care | Payer: 59 | Source: Ambulatory Visit | Attending: Family Medicine | Admitting: Family Medicine

## 2021-10-11 ENCOUNTER — Other Ambulatory Visit: Payer: Self-pay | Admitting: Family Medicine

## 2021-10-11 VITALS — BP 110/72 | HR 80 | Ht 66.0 in | Wt 171.0 lb

## 2021-10-11 DIAGNOSIS — E785 Hyperlipidemia, unspecified: Secondary | ICD-10-CM | POA: Diagnosis not present

## 2021-10-11 DIAGNOSIS — N63 Unspecified lump in unspecified breast: Secondary | ICD-10-CM

## 2021-10-11 DIAGNOSIS — I10 Essential (primary) hypertension: Secondary | ICD-10-CM | POA: Diagnosis not present

## 2021-10-11 DIAGNOSIS — I2510 Atherosclerotic heart disease of native coronary artery without angina pectoris: Secondary | ICD-10-CM

## 2021-10-11 DIAGNOSIS — R030 Elevated blood-pressure reading, without diagnosis of hypertension: Secondary | ICD-10-CM

## 2021-10-11 DIAGNOSIS — R922 Inconclusive mammogram: Secondary | ICD-10-CM | POA: Diagnosis not present

## 2021-10-11 DIAGNOSIS — N6311 Unspecified lump in the right breast, upper outer quadrant: Secondary | ICD-10-CM | POA: Diagnosis not present

## 2021-10-11 NOTE — Patient Instructions (Signed)
Medication Instructions:  ?Your physician recommends that you continue on your current medications as directed. Please refer to the Current Medication list given to you today. ? ?*If you need a refill on your cardiac medications before your next appointment, please call your pharmacy* ? ? ?Lab Work: ?None ?If you have labs (blood work) drawn today and your tests are completely normal, you will receive your results only by: ?MyChart Message (if you have MyChart) OR ?A paper copy in the mail ?If you have any lab test that is abnormal or we need to change your treatment, we will call you to review the results. ? ? ?Testing/Procedures: ?None ? ? ?Follow-Up: ?At [facility], you and your health needs are our priority.  As part of our continuing mission to provide you with exceptional heart care, we have created designated Provider Care Teams.  These Care Teams include your primary Cardiologist (physician) and Advanced Practice Providers (APPs -  Physician Assistants and Nurse Practitioners) who all work together to provide you with the care you need, when you need it. ? ?We recommend signing up for the patient portal called "MyChart".  Sign up information is provided on this After Visit Summary.  MyChart is used to connect with patients for Virtual Visits (Telemedicine).  Patients are able to view lab/test results, encounter notes, upcoming appointments, etc.  Non-urgent messages can be sent to your provider as well.   ?To learn more about what you can do with MyChart, go to [email or web address].   ? ?Your next appointment:   ?1 year(s) ? ?The format for your next appointment:   ?In Person ? ?Provider:   ?[name], MD  ? ? ?Other Instructions ?None ? ?

## 2021-10-11 NOTE — Progress Notes (Signed)
?Cardiology Office Note:   ? ?Date:  10/11/2021  ? ?ID:  Patricia Rush, DOB [birth year], MRN [id number] ? ?PCP:  [name], MD  ?Cardiologist:  [name], MD   ? ?Referring MD: [name], MD  ? ?Chief Complaint  ?Patient presents with  ? Follow-up  ?I am doing fine ? ?History of Present Illness:   ? ?Patricia Rush is a 66 y.o. female with past medical history significant for dyslipidemia, coronary artery disease.  She did have coronary CT angio done in December 2020 which showed multiple, likely hemodynamically insignificant lesions probably the tightest lesion was in the proximal LAD after that she had a fractional flow reserve checked which was negative for hemodynamically significant lesions.  The key is risk factors modifications.  She does have borderline hypertension but also dyslipidemia both manage appropriately.  She comes today to my office for follow-up.  Overall doing very well.  She denies have any chest pain tightness squeezing pressure burning chest ? ?Past Medical History:  ?Diagnosis Date  ? Atypical chest pain 11/12/2019  ? Borderline blood pressure 03/17/2019  ? Chicken pox   ? Coronary artery disease 12/12/2019  ? Dyslipidemia 11/12/2019  ? Essential hypertension 11/12/2019  ? [place] measles   ? Seasonal allergies   ? ? ?Past Surgical History:  ?Procedure Laterality Date  ? AUGMENTATION MAMMAPLASTY    ? MANDIBLE SURGERY    ? TONSILLECTOMY    ? ? ?Current Medications: ?Current Meds  ?Medication Sig  ? aspirin EC 81 MG tablet Take 81 mg by mouth daily.  ? atorvastatin (LIPITOR) 80 MG tablet TAKE 1 TABLET BY MOUTH DAILY (Patient taking differently: Take 80 mg by mouth daily.)  ? Calcium Carbonate (CALCIUM 500 PO) Take 1,000 mg by mouth daily.  ? Cholecalciferol (VITAMIN D) 50 MCG (2000 UT) CAPS Take 2,000 Units by mouth daily.  ? COVID-19 mRNA bivalent vaccine, Pfizer, injection Inject into the muscle. (Patient taking differently: Inject 0.3 mLs into the muscle once.)  ? COVID-19  mRNA vaccine, Moderna, (MODERNA COVID-19 VACCINE) 100 MCG/0.5ML injection Inject into the muscle. (Patient taking differently: Inject 0.5 mLs into the muscle once.)  ? denosumab (PROLIA) 60 MG/ML SOSY injection Inject 60 mg into the skin once for 1 dose.  ? lisinopril (ZESTRIL) 20 MG tablet Take 1 tablet (20 mg total) by mouth daily.  ? nitroGLYCERIN (NITROSTAT) 0.4 MG SL tablet Place 1 tablet (0.4 mg total) under the tongue every 5 (five) minutes as needed for chest pain.  ?  ? ?Allergies:   Patient has no known allergies.  ? ?Social History  ? ?Socioeconomic History  ? Marital status: Married  ?  Spouse name: Not on file  ? Number of children: Not on file  ? Years of education: Not on file  ? Highest education level: Not on file  ?Occupational History  ? Not on file  ?Tobacco Use  ? Smoking status: Never  ? Smokeless tobacco: Never  ?Substance and Sexual Activity  ? Alcohol use: Never  ? Drug use: Never  ? Sexual activity: Yes  ?  Birth control/protection: None  ?Other Topics Concern  ? Not on file  ?Social History Narrative  ? Not on file  ? ?Social Determinants of Health  ? ?Financial Resource Strain: Not on file  ?Food Insecurity: Not on file  ?Transportation Needs: Not on file  ?Physical Activity: Not on file  ?Stress: Not on file  ?Social Connections: Not on file  ?  ? ?  Family History: ?The patient's family history includes Alzheimer's disease in her father. ?ROS:   ?Please see the history of present illness.    ?All 14 point review of systems negative except as described per history of present illness ? ?EKGs/Labs/Other Studies Reviewed:   ? ? ? ?Recent Labs: ?04/14/2021: BUN 17; Creatinine, Ser 0.81; Potassium 4.5; Sodium 143  ?Recent Lipid Panel ?   ?Component Value Date/Time  ? CHOL 137 04/14/2021 1019  ? TRIG 56 04/14/2021 1019  ? HDL 60 04/14/2021 1019  ? CHOLHDL 2.3 04/14/2021 1019  ? CHOLHDL 3 09/20/2020 1340  ? VLDL 28.0 09/20/2020 1340  ? Gleason 65 04/14/2021 1019  ? ? ?Physical Exam:   ? ?VS:  BP  110/72 (BP Location: Left Arm, Patient Position: Sitting)   Pulse 80   Ht '5\' 6"'$  (1.676 m)   Wt 171 lb (77.6 kg)   SpO2 97%   BMI 27.60 kg/m?    ? ?Wt Readings from Last 3 Encounters:  ?10/11/21 171 lb (77.6 kg)  ?04/06/21 164 lb (74.4 kg)  ?12/29/20 160 lb (72.6 kg)  ?  ? ?GEN:  Well nourished, well developed in no acute distress ?HEENT: Normal ?NECK: No JVD; No carotid bruits ?LYMPHATICS: No lymphadenopathy ?CARDIAC: RRR, no murmurs, no rubs, no gallops ?RESPIRATORY:  Clear to auscultation without rales, wheezing or rhonchi  ?ABDOMEN: Soft, non-tender, non-distended ?MUSCULOSKELETAL:  No edema; No deformity  ?SKIN: Warm and dry ?LOWER EXTREMITIES: no swelling ?NEUROLOGIC:  Alert and oriented x 3 ?PSYCHIATRIC:  Normal affect  ? ?ASSESSMENT:   ? ?1. Essential hypertension   ?2. Coronary artery disease involving native coronary artery of native heart without angina pectoris   ?3. Dyslipidemia   ?4. Borderline blood pressure   ? ?PLAN:   ? ?In order of problems listed above: ? ?Essential hypertension blood pressure perfectly controlled we will continue present management. ?Dyslipidemia I did review her K PN which show me LDL of 65 HDL 60 this is from September 2022 we will continue present management which include high intense statin in form of Lipitor 80. ?Coronary artery disease likely hemodynamically insignificant and asymptomatic.  The key is risk factors modifications, she is on antiplatelet therapy she is on statin blood pressure is controlled we did talk about healthy lifestyle need to exercise on the regular basis as well as we discussed basic of Mediterranean diet which she is trying to do. ? ? ?Medication Adjustments/Labs and Tests Ordered: ?Current medicines are reviewed at length with the patient today.  Concerns regarding medicines are outlined above.  ?No orders of the defined types were placed in this encounter. ? ?Medication changes: No orders of the defined types were placed in this  encounter. ? ? ?Signed, ?Park Liter, MD, [facility] ?10/11/2021 8:19 AM    ?[place] ?

## 2021-10-12 ENCOUNTER — Ambulatory Visit (INDEPENDENT_AMBULATORY_CARE_PROVIDER_SITE_OTHER): Payer: 59 | Admitting: *Deleted

## 2021-10-12 ENCOUNTER — Telehealth: Payer: Self-pay | Admitting: *Deleted

## 2021-10-12 DIAGNOSIS — M810 Age-related osteoporosis without current pathological fracture: Secondary | ICD-10-CM | POA: Diagnosis not present

## 2021-10-12 MED ORDER — DENOSUMAB 60 MG/ML ~~LOC~~ SOSY
60.0000 mg | PREFILLED_SYRINGE | Freq: Once | SUBCUTANEOUS | Status: AC
  Administered 2021-10-12: 60 mg via SUBCUTANEOUS

## 2021-10-12 NOTE — Progress Notes (Addendum)
Patient here for prolia injection per Dr. Lorelei Pont. ? ?Injection given in left arm and patient tolerated well. ?

## 2021-10-12 NOTE — Telephone Encounter (Signed)
Prolia injection given today. 

## 2021-10-13 NOTE — Telephone Encounter (Signed)
Please update Prolia book. ?

## 2021-10-15 NOTE — Telephone Encounter (Signed)
Creft, Darlis Loan, CMA  You 2 days ago  ? ?Please update Prolia book.  ?  ?  ?Note   ? ?Kem Boroughs D, CMA routed conversation to You; Team Prolia 3 days ago  ? ?Kem Boroughs D, CMA 3 days ago  ? ?Prolia injection given today.  ?  ?  ?Note   ? ?

## 2021-10-15 NOTE — Telephone Encounter (Signed)
Last Prolia inj 10/12/21 ?Next Prolia inj due 04/15/22 ?

## 2021-11-11 ENCOUNTER — Other Ambulatory Visit (HOSPITAL_COMMUNITY): Payer: Self-pay

## 2021-11-23 ENCOUNTER — Other Ambulatory Visit: Payer: Self-pay | Admitting: Family Medicine

## 2021-11-23 DIAGNOSIS — Z13220 Encounter for screening for lipoid disorders: Secondary | ICD-10-CM

## 2021-11-24 ENCOUNTER — Other Ambulatory Visit (HOSPITAL_COMMUNITY): Payer: Self-pay

## 2021-11-24 MED ORDER — ATORVASTATIN CALCIUM 80 MG PO TABS
ORAL_TABLET | Freq: Every day | ORAL | 3 refills | Status: DC
  Filled 2021-11-24: qty 90, 90d supply, fill #0
  Filled 2022-03-06: qty 90, 90d supply, fill #1
  Filled 2022-07-18: qty 90, 90d supply, fill #2
  Filled 2022-10-16: qty 90, 90d supply, fill #3

## 2021-12-12 DIAGNOSIS — Z1211 Encounter for screening for malignant neoplasm of colon: Secondary | ICD-10-CM | POA: Diagnosis not present

## 2021-12-22 LAB — COLOGUARD: COLOGUARD: POSITIVE — AB

## 2021-12-26 ENCOUNTER — Telehealth: Payer: Self-pay | Admitting: Family Medicine

## 2021-12-26 DIAGNOSIS — R195 Other fecal abnormalities: Secondary | ICD-10-CM

## 2021-12-26 NOTE — Patient Instructions (Incomplete)
Good to see you today - I will be in touch with your pap and your skin path report Let me know if you don't hear from GI soon Leave the skin lesion site clean and dry until tomorrow- then ok to shower as usual If any concerns about infection or bleeding please contact me!  If bleeding first try firm pressure for 10 -15 minutes

## 2021-12-26 NOTE — Telephone Encounter (Signed)
Called pt-discussed positive Cologuard.  Will refer to gastroenterology, she would like to see Dr. Glo Herring referral  She also has a skin concern on her back, schedule her for this coming Wednesday

## 2021-12-26 NOTE — Progress Notes (Unsigned)
[facility] [address], [place], [place] [address] [phone]  Date:  12/28/2021   Name:  Patricia Rush   DOB:  December 26, 1955   MRN:  [id number]  PCP:  [name], MD    Chief Complaint: Skin Problem (Concerns/ questions: /)   History of Present Illness:  Patricia Rush is a 66 y.o. very pleasant female patient who presents with the following:  Pt seen today for skin concern on her back- History of HTN, dyslipidemia, CAD Last seen by myself 2/22 for a CPE Pharmacist, married with an adult daughter  Can update pap today if she would like Mammo UTD Colon - positive cologuard, referral to GI made   Patient had noted a skin tag in the middle of her back for quite some time, however over the last few weeks it has become quite irritated and scabbed.  It has been tender and may bleed when touched.  Her husband is quite concerned!   Patient Active Problem List   Diagnosis Date Noted   Osteoporosis 09/30/2020   Seasonal allergies    German measles    Chicken pox    Coronary artery disease 12/12/2019   Atypical chest pain 11/12/2019   Dyslipidemia 11/12/2019   Essential hypertension 11/12/2019   Borderline blood pressure 03/17/2019    Past Medical History:  Diagnosis Date   Atypical chest pain 11/12/2019   Borderline blood pressure 03/17/2019   Chicken pox    Coronary artery disease 12/12/2019   Dyslipidemia 11/12/2019   Essential hypertension 11/12/2019   German measles    Seasonal allergies     Past Surgical History:  Procedure Laterality Date   AUGMENTATION MAMMAPLASTY     MANDIBLE SURGERY     TONSILLECTOMY      Social History   Tobacco Use   Smoking status: Never   Smokeless tobacco: Never  Substance Use Topics   Alcohol use: Never   Drug use: Never    Family History  Problem Relation Age of Onset   Alzheimer's disease Father     No Known Allergies  Medication list has been reviewed  and updated.  Current Outpatient Medications on File Prior to Visit  Medication Sig Dispense Refill   aspirin EC 81 MG tablet Take 81 mg by mouth daily.     atorvastatin (LIPITOR) 80 MG tablet TAKE 1 TABLET BY MOUTH DAILY 90 tablet 3   Calcium Carbonate (CALCIUM 500 PO) Take 1,000 mg by mouth daily.     Cholecalciferol (VITAMIN D) 50 MCG (2000 UT) CAPS Take 2,000 Units by mouth daily.     COVID-19 mRNA bivalent vaccine, Pfizer, injection Inject into the muscle. (Patient taking differently: Inject 0.3 mLs into the muscle once.) 0.3 mL 0   COVID-19 mRNA vaccine, Moderna, (MODERNA COVID-19 VACCINE) 100 MCG/0.5ML injection Inject into the muscle. (Patient taking differently: Inject 0.5 mLs into the muscle once.) 0.25 mL 0   lisinopril (ZESTRIL) 20 MG tablet Take 1 tablet (20 mg total) by mouth daily. 90 tablet 3   nitroGLYCERIN (NITROSTAT) 0.4 MG SL tablet Place 1 tablet (0.4 mg total) under the tongue every 5 (five) minutes as needed for chest pain. 25 tablet 3   No current facility-administered medications on file prior to visit.    Review of Systems:  As per HPI- otherwise negative.   Physical Examination: Vitals:   12/28/21 1449  BP: 122/72  Pulse: 72  Resp: 18  Temp: 97.7 F (36.5 C)  SpO2: 94%   Vitals:   12/28/21 1449  Weight: 169 lb 12.8 oz (77 kg)  Height: '5\' 6"'$  (1.676 m)   Body mass index is 27.41 kg/m. Ideal Body Weight: Weight in (lb) to have BMI = 25: 154.6  GEN: no acute distress.  Looks well, minimal overweight HEENT: Atraumatic, Normocephalic.  Ears and Nose: No external deformity. CV: RRR, No M/G/R. No JVD. No thrill. No extra heart sounds. PULM: CTA B, no wheezes, crackles, rhonchi. No retractions. No resp. distress. No accessory muscle use. ABD: S, NT, ND, +BS. No rebound. No HSM. EXTR: No c/c/e PSYCH: Normally interactive. Conversant.  Pap smear collected today.  Normal pelvic exam, normal cervix and adnexa.  No masses or lesions  There is a  pedunculated, irritated skin tag located midline of her spine, at approximately T10 level.  The skin tag measures approximately 1 cm diameter on a 4 mm pedunculated stalk  Verbal consent obtained.  Area prepped with Betadine, anesthesia provided with approximately 1 mL of 1% lidocaine with epinephrine.  Skin tag was grasped with forceps and removed with derma blade.  Estimated blood loss less than 5 mL.  Pressure and silver nitrate used to achieve hemostasis, dressed with a Band-Aid.  Patient tolerated well with no immediate complications  Assessment and Plan: Skin lesion of back - Plan: Dermatology pathology  Screening for cervical cancer - Plan: Cytology - PAP  Skin tag removed and sent to pathology as above.  Pap smear collected Will plan further follow- up pending labs.   Signed Lamar Blinks, MD

## 2021-12-28 ENCOUNTER — Ambulatory Visit: Payer: 59 | Admitting: Family Medicine

## 2021-12-28 ENCOUNTER — Other Ambulatory Visit (HOSPITAL_COMMUNITY)
Admission: RE | Admit: 2021-12-28 | Discharge: 2021-12-28 | Disposition: A | Discharge status: Home / Self Care | Payer: 59 | Source: Ambulatory Visit | Attending: Family Medicine | Admitting: Family Medicine

## 2021-12-28 VITALS — BP 122/72 | HR 72 | Temp 97.7°F | Resp 18 | Ht 66.0 in | Wt 169.8 lb

## 2021-12-28 DIAGNOSIS — Z124 Encounter for screening for malignant neoplasm of cervix: Secondary | ICD-10-CM | POA: Diagnosis not present

## 2021-12-28 DIAGNOSIS — D235 Other benign neoplasm of skin of trunk: Secondary | ICD-10-CM

## 2021-12-28 DIAGNOSIS — L989 Disorder of the skin and subcutaneous tissue, unspecified: Secondary | ICD-10-CM

## 2021-12-28 DIAGNOSIS — L918 Other hypertrophic disorders of the skin: Secondary | ICD-10-CM | POA: Diagnosis not present

## 2021-12-28 DIAGNOSIS — B079 Viral wart, unspecified: Secondary | ICD-10-CM | POA: Diagnosis not present

## 2021-12-30 ENCOUNTER — Encounter: Payer: Self-pay | Admitting: Family Medicine

## 2021-12-30 LAB — CYTOLOGY - PAP
Comment: NEGATIVE
Diagnosis: NEGATIVE
High risk HPV: NEGATIVE

## 2022-01-04 ENCOUNTER — Encounter: Payer: Self-pay | Admitting: Family Medicine

## 2022-01-11 ENCOUNTER — Other Ambulatory Visit (HOSPITAL_BASED_OUTPATIENT_CLINIC_OR_DEPARTMENT_OTHER): Payer: Self-pay

## 2022-01-23 DIAGNOSIS — Z1211 Encounter for screening for malignant neoplasm of colon: Secondary | ICD-10-CM | POA: Diagnosis not present

## 2022-01-23 DIAGNOSIS — E782 Mixed hyperlipidemia: Secondary | ICD-10-CM | POA: Diagnosis not present

## 2022-01-23 DIAGNOSIS — I10 Essential (primary) hypertension: Secondary | ICD-10-CM | POA: Diagnosis not present

## 2022-02-06 ENCOUNTER — Other Ambulatory Visit (HOSPITAL_COMMUNITY): Payer: Self-pay

## 2022-02-06 ENCOUNTER — Other Ambulatory Visit: Payer: Self-pay | Admitting: Cardiology

## 2022-02-06 MED ORDER — NITROGLYCERIN 0.4 MG SL SUBL
0.4000 mg | SUBLINGUAL_TABLET | SUBLINGUAL | 3 refills | Status: AC | PRN
  Filled 2022-02-06: qty 25, 8d supply, fill #0
  Filled 2022-07-18: qty 25, 8d supply, fill #1

## 2022-02-06 NOTE — Telephone Encounter (Signed)
Rx refill sent to pharmacy. 

## 2022-02-07 IMAGING — CR DG CHEST 2V
2 series · 2 of 2 positions shown · non-contrast
Comparison: 12/20/2016

CLINICAL DATA: Substernal chest pain while driving to work today,
arm, lip and thumb numbness, weakness

EXAM:
CHEST - 2 VIEW

[w chest pa]
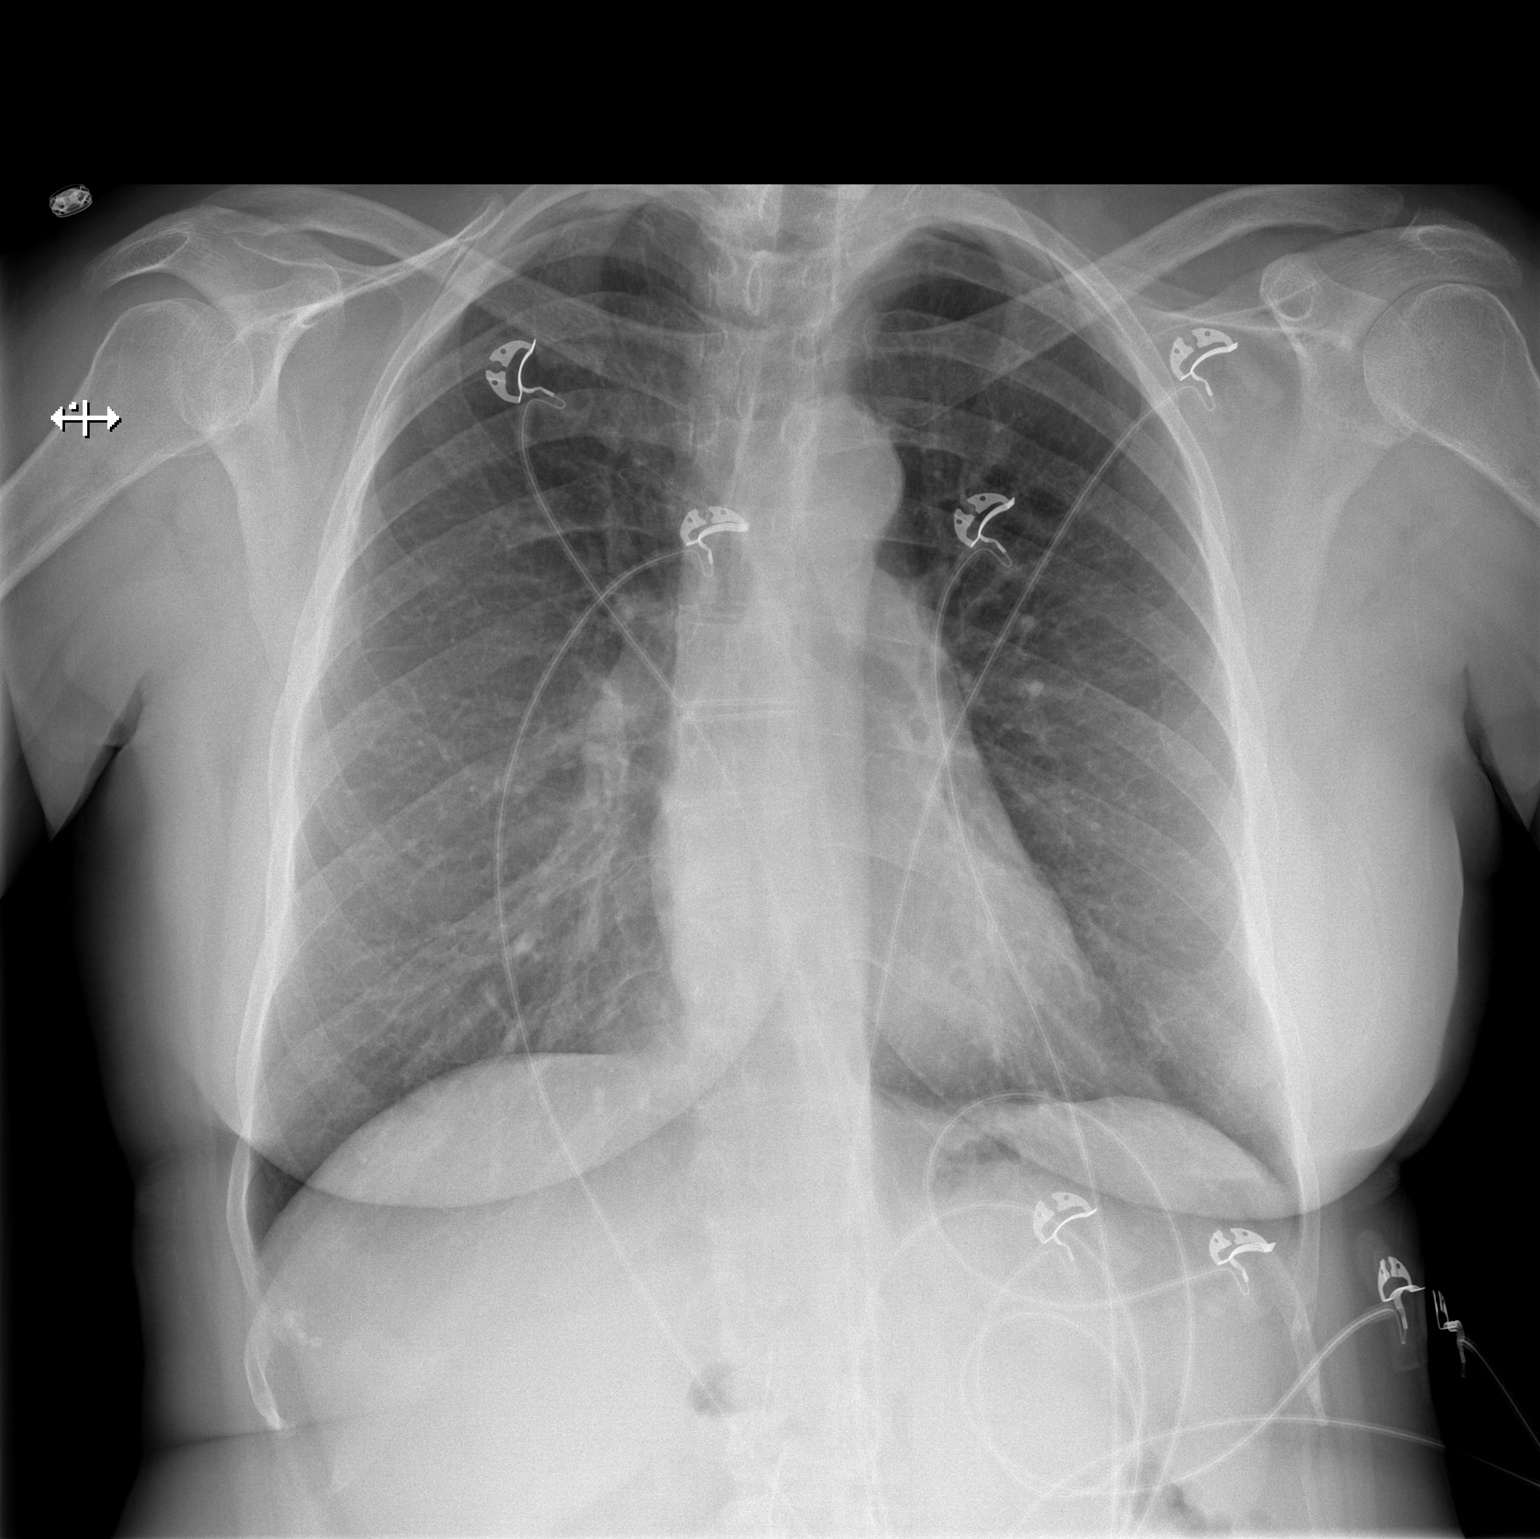

[w chest lat]
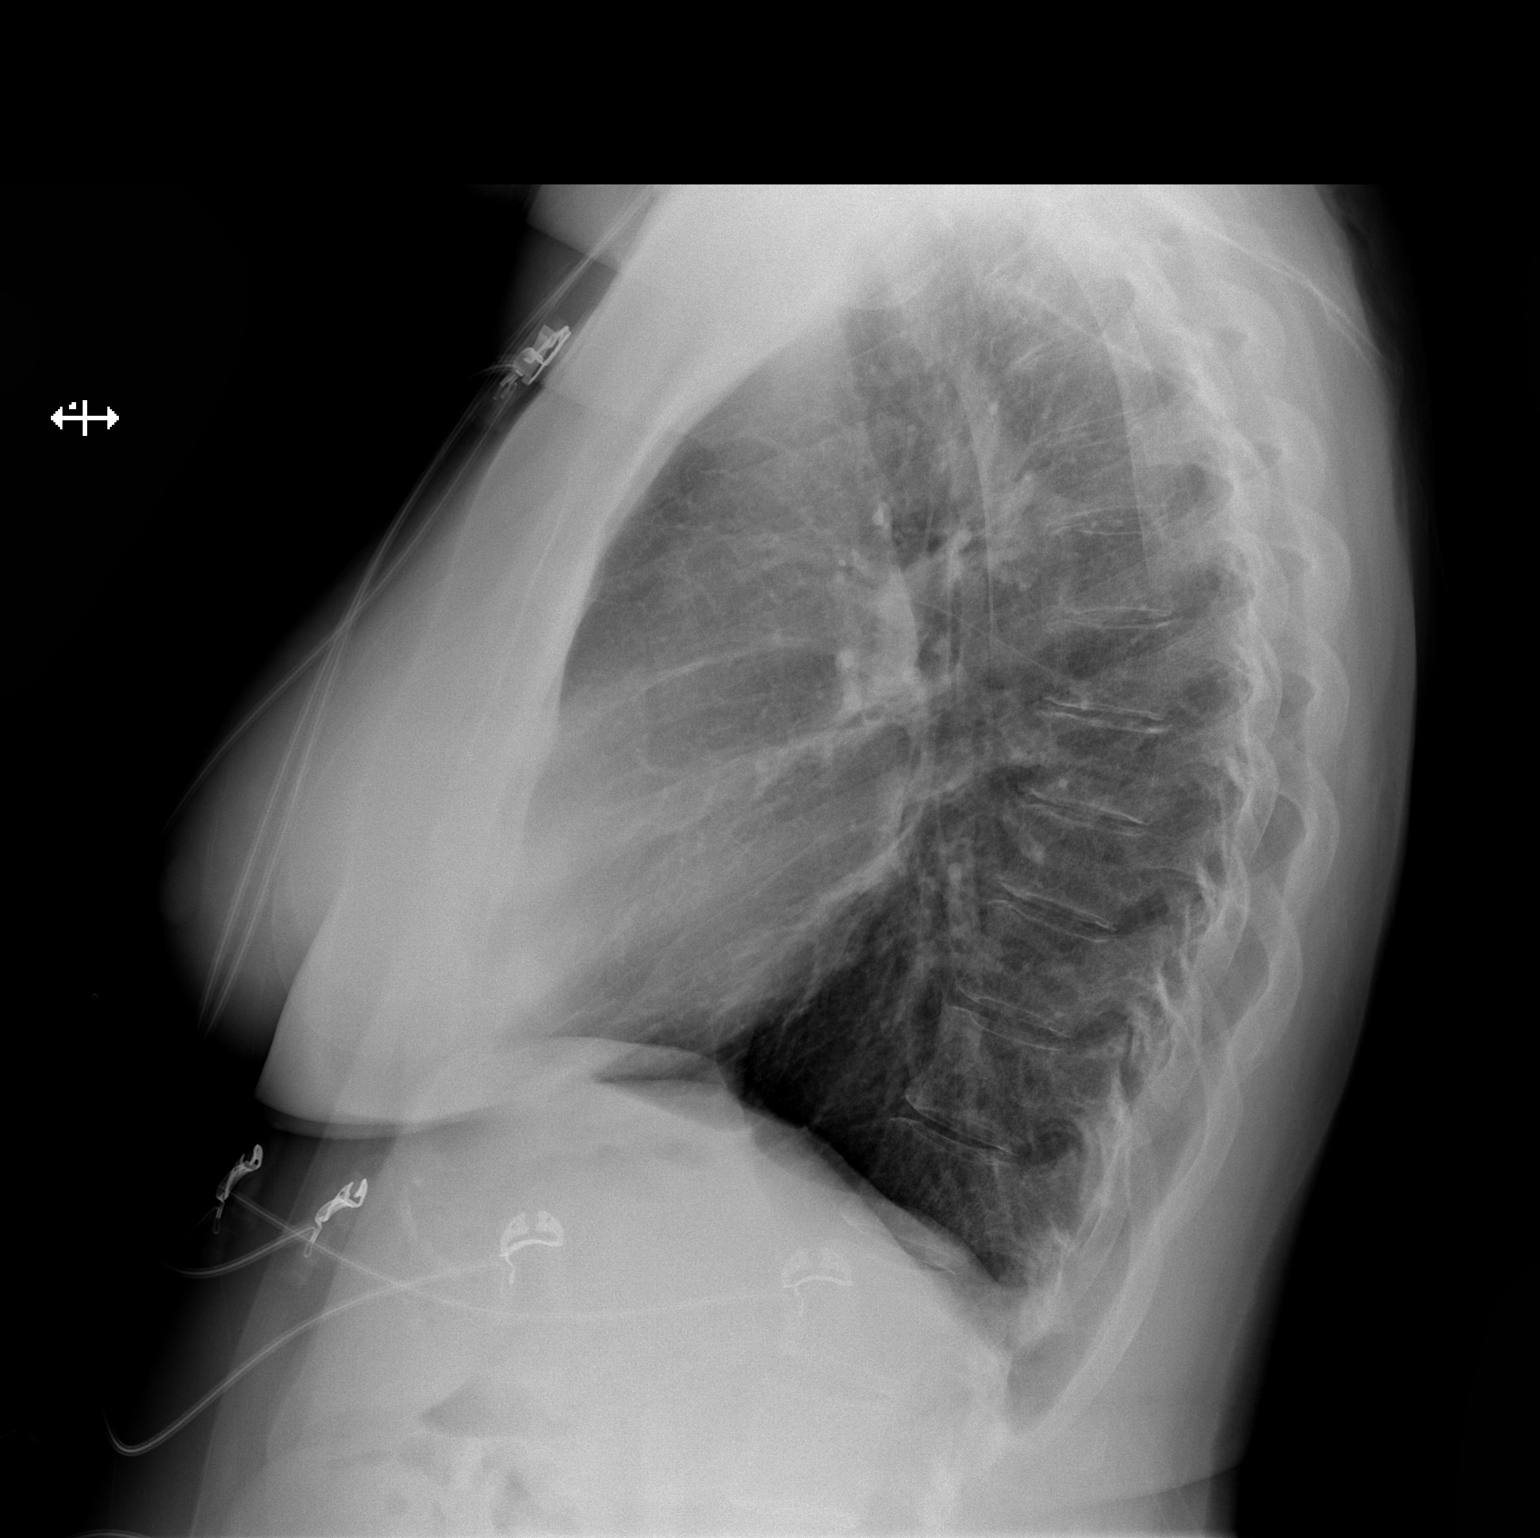

[2 of 2 positions shown; findings below may reference images not displayed]

FINDINGS: Normal heart size, mediastinal contours, and pulmonary vascularity.

Minimal RIGHT apex scarring.

Lungs otherwise clear.

No pulmonary infiltrate, pleural effusion or pneumothorax.

Bones unremarkable.
IMPRESSION: No acute abnormalities.

## 2022-02-22 NOTE — Telephone Encounter (Signed)
Prolia VOB initiated via [email or web address]  Last Prolia inj 10/12/21 Next Prolia inj due 04/15/22   PA#  Valid: 08/16/2021 to 08/15/2022

## 2022-03-03 NOTE — Telephone Encounter (Signed)
Prior auth required for PROLIA  PA PROCESS DETAILS: Prior Authorization is Required. We are unable to confirm if a PA is on file. Please check your records or contact the payer  

## 2022-03-06 ENCOUNTER — Other Ambulatory Visit (HOSPITAL_COMMUNITY): Payer: Self-pay

## 2022-03-09 ENCOUNTER — Other Ambulatory Visit (HOSPITAL_COMMUNITY): Payer: Self-pay

## 2022-03-09 MED ORDER — NA SULFATE-K SULFATE-MG SULF 17.5-3.13-1.6 GM/177ML PO SOLN
ORAL | 0 refills | Status: DC
  Filled 2022-03-09: qty 354, 1d supply, fill #0

## 2022-03-21 DIAGNOSIS — Z1211 Encounter for screening for malignant neoplasm of colon: Secondary | ICD-10-CM | POA: Diagnosis not present

## 2022-03-21 DIAGNOSIS — K5730 Diverticulosis of large intestine without perforation or abscess without bleeding: Secondary | ICD-10-CM | POA: Diagnosis not present

## 2022-03-21 DIAGNOSIS — D122 Benign neoplasm of ascending colon: Secondary | ICD-10-CM | POA: Diagnosis not present

## 2022-03-21 DIAGNOSIS — K635 Polyp of colon: Secondary | ICD-10-CM | POA: Diagnosis not present

## 2022-03-21 LAB — HM COLONOSCOPY

## 2022-03-22 ENCOUNTER — Other Ambulatory Visit (HOSPITAL_COMMUNITY): Payer: Self-pay

## 2022-03-22 DIAGNOSIS — L308 Other specified dermatitis: Secondary | ICD-10-CM | POA: Diagnosis not present

## 2022-03-22 DIAGNOSIS — L821 Other seborrheic keratosis: Secondary | ICD-10-CM | POA: Diagnosis not present

## 2022-03-22 MED ORDER — CLOBETASOL PROPIONATE 0.05 % EX OINT
TOPICAL_OINTMENT | CUTANEOUS | 0 refills | Status: AC
  Filled 2022-03-22: qty 30, 21d supply, fill #0

## 2022-04-06 ENCOUNTER — Telehealth: Payer: 59 | Admitting: Family Medicine

## 2022-04-06 ENCOUNTER — Other Ambulatory Visit (HOSPITAL_BASED_OUTPATIENT_CLINIC_OR_DEPARTMENT_OTHER): Payer: Self-pay

## 2022-04-06 DIAGNOSIS — U071 COVID-19: Secondary | ICD-10-CM

## 2022-04-06 MED ORDER — MOLNUPIRAVIR EUA 200MG CAPSULE
4.0000 | ORAL_CAPSULE | Freq: Two times a day (BID) | ORAL | 0 refills | Status: AC
  Filled 2022-04-06: qty 40, 5d supply, fill #0

## 2022-04-06 MED ORDER — MOLNUPIRAVIR EUA 200MG CAPSULE
4.0000 | ORAL_CAPSULE | Freq: Two times a day (BID) | ORAL | 0 refills | Status: DC
  Filled 2022-04-06: qty 40, 5d supply, fill #0

## 2022-04-06 NOTE — Progress Notes (Signed)
Virtual Visit Consent   Patricia Rush, you are scheduled for a virtual visit with a [facility] provider today. Just as with appointments in the office, your consent must be obtained to participate. Your consent will be active for this visit and any virtual visit you may have with one of our providers in the next 365 days. If you have a MyChart account, a copy of this consent can be sent to you electronically.  As this is a virtual visit, video technology does not allow for your provider to perform a traditional examination. This may limit your provider's ability to fully assess your condition. If your provider identifies any concerns that need to be evaluated in person or the need to arrange testing (such as labs, EKG, etc.), we will make arrangements to do so. Although advances in technology are sophisticated, we cannot ensure that it will always work on either your end or our end. If the connection with a video visit is poor, the visit may have to be switched to a telephone visit. With either a video or telephone visit, we are not always able to ensure that we have a secure connection.  By engaging in this virtual visit, you consent to the provision of healthcare and authorize for your insurance to be billed (if applicable) for the services provided during this visit. Depending on your insurance coverage, you may receive a charge related to this service.  I need to obtain your verbal consent now. Are you willing to proceed with your visit today? Patricia Rush has provided verbal consent on 04/06/2022 for a virtual visit (video or telephone). Patricia Mayo, NP  Date: 04/06/2022 12:03 PM  Virtual Visit via Video Note   I, Patricia Rush, connected with  Patricia Rush  ([id number], 04/27/1956) on 04/06/22 at 12:00 PM EDT by a video-enabled telemedicine application and verified that I am speaking with the correct person using two identifiers.  Location: Patient: Virtual Visit Location Patient:  Home Provider: Virtual Visit Location Provider: Home Office   I discussed the limitations of evaluation and management by telemedicine and the availability of in person appointments. The patient expressed understanding and agreed to proceed.    History of Present Illness: Patricia Rush is a 66 y.o. who identifies as a female who was assigned female at birth, and is being seen today for covid, tested positive Tuesday evening. Quick onset- .achy, hot and cold, cough, congestion. Also has some gi upset with it. Denies chest, shortness of breath, ear pain.   Works at [employer] and [place] is aware  Problems:  Patient Active Problem List   Diagnosis Date Noted   Osteoporosis 09/30/2020   Seasonal allergies    German measles    Chicken pox    Coronary artery disease 12/12/2019   Atypical chest pain 11/12/2019   Dyslipidemia 11/12/2019   Essential hypertension 11/12/2019   Borderline blood pressure 03/17/2019    Allergies: No Known Allergies Medications:  Current Outpatient Medications:    aspirin EC 81 MG tablet, Take 81 mg by mouth daily., Disp: , Rfl:    atorvastatin (LIPITOR) 80 MG tablet, TAKE 1 TABLET BY MOUTH DAILY, Disp: 90 tablet, Rfl: 3   Calcium Carbonate (CALCIUM 500 PO), Take 1,000 mg by mouth daily., Disp: , Rfl:    Cholecalciferol (VITAMIN D) 50 MCG (2000 UT) CAPS, Take 2,000 Units by mouth daily., Disp: , Rfl:    clobetasol ointment (TEMOVATE) 0.05 %, Apply topically twice a day  for up to 2-3 weeks, Disp: 30 g, Rfl: 0   COVID-19 mRNA bivalent vaccine, Pfizer, injection, Inject into the muscle. (Patient taking differently: Inject 0.3 mLs into the muscle once.), Disp: 0.3 mL, Rfl: 0   COVID-19 mRNA vaccine, Moderna, (MODERNA COVID-19 VACCINE) 100 MCG/0.5ML injection, Inject into the muscle. (Patient taking differently: Inject 0.5 mLs into the muscle once.), Disp: 0.25 mL, Rfl: 0   lisinopril (ZESTRIL) 20 MG tablet, Take 1 tablet (20 mg total) by mouth daily., Disp: 90  tablet, Rfl: 3   Na Sulfate-K Sulfate-Mg Sulf (SUPREP BOWEL PREP KIT) 17.5-3.13-1.6 GM/177ML SOLN, Use as directed, Disp: 354 mL, Rfl: 0   nitroGLYCERIN (NITROSTAT) 0.4 MG SL tablet, Place 1 tablet (0.4 mg total) under the tongue every 5 (five) minutes as needed for chest pain., Disp: 25 tablet, Rfl: 3  Observations/Objective: Patient is well-developed, well-nourished in no acute distress.  Resting comfortably  at home.  Head is normocephalic, atraumatic.  No labored breathing.  Speech is clear and coherent with logical content.  Patient is alert and oriented at baseline.  Cough  Nasal tone and voice change noted  Assessment and Plan: 1. COVID-19  - molnupiravir EUA (LAGEVRIO) 200 mg CAPS capsule; Take 4 capsules (800 mg total) by mouth 2 (two) times daily for 5 days.  Dispense: 40 capsule; Refill: 0  -rest -hydrate -OTC reviewed on AVS -med info on avs and reviewed -covid info reviewed and on AVS   Reviewed side effects, risks and benefits of medication.    Patient acknowledged agreement and understanding of the plan.   Past Medical, Surgical, Social History, Allergies, and Medications have been Reviewed.   Follow Up Instructions: I discussed the assessment and treatment plan with the patient. The patient was provided an opportunity to ask questions and all were answered. The patient agreed with the plan and demonstrated an understanding of the instructions.  A copy of instructions were sent to the patient via MyChart unless otherwise noted below.     The patient was advised to call back or seek an in-person evaluation if the symptoms worsen or if the condition fails to improve as anticipated.  Time:  I spent 10 minutes with the patient via telehealth technology discussing the above problems/concerns.    Patricia Mayo, NP

## 2022-04-06 NOTE — Patient Instructions (Signed)
Sherri Rad, thank you for joining Perlie Mayo, NP for today's virtual visit.  While this provider is not your primary care provider (PCP), if your PCP is located in our provider database this encounter information will be shared with them immediately following your visit.  Consent: (Patient) Patricia Rush provided verbal consent for this virtual visit at the beginning of the encounter.  Current Medications:  Current Outpatient Medications:    aspirin EC 81 MG tablet, Take 81 mg by mouth daily., Disp: , Rfl:    atorvastatin (LIPITOR) 80 MG tablet, TAKE 1 TABLET BY MOUTH DAILY, Disp: 90 tablet, Rfl: 3   Calcium Carbonate (CALCIUM 500 PO), Take 1,000 mg by mouth daily., Disp: , Rfl:    Cholecalciferol (VITAMIN D) 50 MCG (2000 UT) CAPS, Take 2,000 Units by mouth daily., Disp: , Rfl:    clobetasol ointment (TEMOVATE) 0.05 %, Apply topically twice a day for up to 2-3 weeks, Disp: 30 g, Rfl: 0   COVID-19 mRNA bivalent vaccine, Pfizer, injection, Inject into the muscle. (Patient taking differently: Inject 0.3 mLs into the muscle once.), Disp: 0.3 mL, Rfl: 0   COVID-19 mRNA vaccine, Moderna, (MODERNA COVID-19 VACCINE) 100 MCG/0.5ML injection, Inject into the muscle. (Patient taking differently: Inject 0.5 mLs into the muscle once.), Disp: 0.25 mL, Rfl: 0   lisinopril (ZESTRIL) 20 MG tablet, Take 1 tablet (20 mg total) by mouth daily., Disp: 90 tablet, Rfl: 3   molnupiravir EUA (LAGEVRIO) 200 mg CAPS capsule, Take 4 capsules (800 mg total) by mouth 2 (two) times daily for 5 days., Disp: 40 capsule, Rfl: 0   Na Sulfate-K Sulfate-Mg Sulf (SUPREP BOWEL PREP KIT) 17.5-3.13-1.6 GM/177ML SOLN, Use as directed, Disp: 354 mL, Rfl: 0   nitroGLYCERIN (NITROSTAT) 0.4 MG SL tablet, Place 1 tablet (0.4 mg total) under the tongue every 5 (five) minutes as needed for chest pain., Disp: 25 tablet, Rfl: 3   Medications ordered in this encounter:  Meds ordered this encounter  Medications   DISCONTD: molnupiravir  EUA (LAGEVRIO) 200 mg CAPS capsule    Sig: Take 4 capsules (800 mg total) by mouth 2 (two) times daily for 5 days.    Dispense:  40 capsule    Refill:  0    Order Specific Question:   Supervising Provider    Answer:   Chase Picket [[id number]]   molnupiravir EUA (LAGEVRIO) 200 mg CAPS capsule    Sig: Take 4 capsules (800 mg total) by mouth 2 (two) times daily for 5 days.    Dispense:  40 capsule    Refill:  0    Fill at [facility]    Order Specific Question:   Supervising Provider    Answer:   Chase Picket [[id number]]     *If you need refills on other medications prior to your next appointment, please contact your pharmacy*  Follow-Up: Call back or seek an in-person evaluation if the symptoms worsen or if the condition fails to improve as anticipated.  Other Instructions Please keep well-hydrated and get plenty of rest. Start a saline nasal rinse to flush out your nasal passages. You can use plain Mucinex to help thin congestion. If you have a humidifier, running in the bedroom at night. I want you to start OTC vitamin D3 1000 units daily, vitamin C 1000 mg daily, and a zinc supplement. Please take prescribed medications as directed.  You have been enrolled in a MyChart symptom monitoring program. Please answer these questions daily  so we can keep track of how you are doing.  You were to quarantine for 5 days from onset of your symptoms.  After day 5, if you have had no fever and you are feeling better, you can end quarantine but need to mask for an additional 5 days. After day 5 if you have a fever or are having significant symptoms, please quarantine for full 10 days.  If you note any worsening of symptoms, any significant shortness of breath or any chest pain, please seek ER evaluation ASAP.  Please do not delay care!  COVID-19: What to Do if You Are Sick If you test positive and are an older adult or someone who is at high risk of getting very sick from COVID-19,  treatment may be available. Contact a healthcare provider right away after a positive test to determine if you are eligible, even if your symptoms are mild right now. You can also visit a Test to Treat location and, if eligible, receive a prescription from a provider. Don't delay: Treatment must be started within the first few days to be effective. If you have a fever, cough, or other symptoms, you might have COVID-19. Most people have mild illness and are able to recover at home. If you are sick: Keep track of your symptoms. If you have an emergency warning sign (including trouble breathing), call 911. Steps to help prevent the spread of COVID-19 if you are sick If you are sick with COVID-19 or think you might have COVID-19, follow the steps below to care for yourself and to help protect other people in your home and community. Stay home except to get medical care Stay home. Most people with COVID-19 have mild illness and can recover at home without medical care. Do not leave your home, except to get medical care. Do not visit public areas and do not go to places where you are unable to wear a mask. Take care of yourself. Get rest and stay hydrated. Take over-the-counter medicines, such as acetaminophen, to help you feel better. Stay in touch with your doctor. Call before you get medical care. Be sure to get care if you have trouble breathing, or have any other emergency warning signs, or if you think it is an emergency. Avoid public transportation, ride-sharing, or taxis if possible. Get tested If you have symptoms of COVID-19, get tested. While waiting for test results, stay away from others, including staying apart from those living in your household. Get tested as soon as possible after your symptoms start. Treatments may be available for people with COVID-19 who are at risk for becoming very sick. Don't delay: Treatment must be started early to be effective--some treatments must begin within 5  days of your first symptoms. Contact your healthcare provider right away if your test result is positive to determine if you are eligible. Self-tests are one of several options for testing for the virus that causes COVID-19 and may be more convenient than laboratory-based tests and point-of-care tests. Ask your healthcare provider or your local health department if you need help interpreting your test results. You can visit your state, tribal, local, and territorial health department's website to look for the latest local information on testing sites. Separate yourself from other people As much as possible, stay in a specific room and away from other people and pets in your home. If possible, you should use a separate bathroom. If you need to be around other people or animals in or outside of  the home, wear a well-fitting mask. Tell your close contacts that they may have been exposed to COVID-19. An infected person can spread COVID-19 starting 48 hours (or 2 days) before the person has any symptoms or tests positive. By letting your close contacts know they may have been exposed to COVID-19, you are helping to protect everyone. See COVID-19 and Animals if you have questions about pets. If you are diagnosed with COVID-19, someone from the health department may call you. Answer the call to slow the spread. Monitor your symptoms Symptoms of COVID-19 include fever, cough, or other symptoms. Follow care instructions from your healthcare provider and local health department. Your local health authorities may give instructions on checking your symptoms and reporting information. When to seek emergency medical attention Look for emergency warning signs* for COVID-19. If someone is showing any of these signs, seek emergency medical care immediately: Trouble breathing Persistent pain or pressure in the chest New confusion Inability to wake or stay awake Pale, gray, or blue-colored skin, lips, or nail beds,  depending on skin tone *This list is not all possible symptoms. Please call your medical provider for any other symptoms that are severe or concerning to you. Call 911 or call ahead to your local emergency facility: Notify the operator that you are seeking care for someone who has or may have COVID-19. Call ahead before visiting your doctor Call ahead. Many medical visits for routine care are being postponed or done by phone or telemedicine. If you have a medical appointment that cannot be postponed, call your doctor's office, and tell them you have or may have COVID-19. This will help the office protect themselves and other patients. If you are sick, wear a well-fitting mask You should wear a mask if you must be around other people or animals, including pets (even at home). Wear a mask with the best fit, protection, and comfort for you. You don't need to wear the mask if you are alone. If you can't put on a mask (because of trouble breathing, for example), cover your coughs and sneezes in some other way. Try to stay at least 6 feet away from other people. This will help protect the people around you. Masks should not be placed on young children under age 26 years, anyone who has trouble breathing, or anyone who is not able to remove the mask without help. Cover your coughs and sneezes Cover your mouth and nose with a tissue when you cough or sneeze. Throw away used tissues in a lined trash can. Immediately wash your hands with soap and water for at least 20 seconds. If soap and water are not available, clean your hands with an alcohol-based hand sanitizer that contains at least 60% alcohol. Clean your hands often Wash your hands often with soap and water for at least 20 seconds. This is especially important after blowing your nose, coughing, or sneezing; going to the bathroom; and before eating or preparing food. Use hand sanitizer if soap and water are not available. Use an alcohol-based hand  sanitizer with at least 60% alcohol, covering all surfaces of your hands and rubbing them together until they feel dry. Soap and water are the best option, especially if hands are visibly dirty. Avoid touching your eyes, nose, and mouth with unwashed hands. Handwashing Tips Avoid sharing personal household items Do not share dishes, drinking glasses, cups, eating utensils, towels, or bedding with other people in your home. Wash these items thoroughly after using them with soap and  water or put in the dishwasher. Clean surfaces in your home regularly Clean and disinfect high-touch surfaces (for example, doorknobs, tables, handles, light switches, and countertops) in your "sick room" and bathroom. In shared spaces, you should clean and disinfect surfaces and items after each use by the person who is ill. If you are sick and cannot clean, a caregiver or other person should only clean and disinfect the area around you (such as your bedroom and bathroom) on an as needed basis. Your caregiver/other person should wait as long as possible (at least several hours) and wear a mask before entering, cleaning, and disinfecting shared spaces that you use. Clean and disinfect areas that may have blood, stool, or body fluids on them. Use household cleaners and disinfectants. Clean visible dirty surfaces with household cleaners containing soap or detergent. Then, use a household disinfectant. Use a product from [employer] List N: Disinfectants for Coronavirus (QKMMN-81). Be sure to follow the instructions on the label to ensure safe and effective use of the product. Many products recommend keeping the surface wet with a disinfectant for a certain period of time (look at "contact time" on the product label). You may also need to wear personal protective equipment, such as gloves, depending on the directions on the product label. Immediately after disinfecting, wash your hands with soap and water for 20 seconds. For  completed guidance on cleaning and disinfecting your home, visit Complete Disinfection Guidance. Take steps to improve ventilation at home Improve ventilation (air flow) at home to help prevent from spreading COVID-19 to other people in your household. Clear out COVID-19 virus particles in the air by opening windows, using air filters, and turning on fans in your home. Use this interactive tool to learn how to improve air flow in your home. When you can be around others after being sick with COVID-19 Deciding when you can be around others is different for different situations. Find out when you can safely end home isolation. For any additional questions about your care, contact your healthcare provider or state or local health department. 10/12/2020 Content source: [facility] ([facility]), Division of Viral Diseases This information is not intended to replace advice given to you by your health care provider. Make sure you discuss any questions you have with your health care provider. Document Revised: 11/25/2020 Document Reviewed: 11/25/2020 Elsevier Patient Education  2022 [employer].      If you have been instructed to have an in-person evaluation today at a local Urgent Care facility, please use the link below. It will take you to a list of all of our available [facility], including address, phone number and hours of operation. Please do not delay care.  [facility]  If you or a family member do not have a primary care provider, use the link below to schedule a visit and establish care. When you choose a [facility] primary care physician or advanced practice provider, you gain a long-term partner in health. Find a Primary Care Provider  Learn more about [facility]'s in-office and virtual care options: [facility]

## 2022-05-03 ENCOUNTER — Other Ambulatory Visit: Payer: Self-pay | Admitting: Cardiology

## 2022-05-03 ENCOUNTER — Other Ambulatory Visit (HOSPITAL_COMMUNITY): Payer: Self-pay

## 2022-05-03 MED ORDER — LISINOPRIL 20 MG PO TABS
20.0000 mg | ORAL_TABLET | Freq: Every day | ORAL | 0 refills | Status: DC
  Filled 2022-05-10: qty 90, 90d supply, fill #0

## 2022-05-04 ENCOUNTER — Other Ambulatory Visit: Payer: Self-pay

## 2022-05-04 MED ORDER — COVID-19 MRNA 2023-2024 VACCINE (COMIRNATY) 0.3 ML INJECTION
INTRAMUSCULAR | 0 refills | Status: DC
  Filled 2022-05-04: qty 0.3, 1d supply, fill #0

## 2022-05-10 ENCOUNTER — Other Ambulatory Visit (HOSPITAL_COMMUNITY): Payer: Self-pay

## 2022-05-18 ENCOUNTER — Other Ambulatory Visit (HOSPITAL_BASED_OUTPATIENT_CLINIC_OR_DEPARTMENT_OTHER): Payer: Self-pay

## 2022-05-18 MED ORDER — DENOSUMAB 60 MG/ML ~~LOC~~ SOSY
60.0000 mg | PREFILLED_SYRINGE | Freq: Once | SUBCUTANEOUS | 0 refills | Status: AC
  Filled 2022-05-18 – 2022-06-13 (×2): qty 1, 1d supply, fill #0

## 2022-05-18 NOTE — Addendum Note (Signed)
Addended by: Jasper Loser on: 05/18/2022 11:19 AM   Modules accepted: Orders

## 2022-05-18 NOTE — Telephone Encounter (Signed)
Member has an active PA on file which is expiring on 08/15/2022 and has 1 no. of fills remaining.    Rx sent to [facility].

## 2022-05-18 NOTE — Telephone Encounter (Signed)
Mychart message sent.

## 2022-05-18 NOTE — Telephone Encounter (Addendum)
Pt ready for scheduling on or after 04/15/22  Out-of-pocket cost due at time of visit: $50  Primary: [employer] Prolia co-insurance: pharmacy will advise - rx sent to [facility] fee co-insurance: $50  Secondary: n/a Prolia co-insurance:  Admin fee co-insurance:   Deductible: $350 of $350 met  Prior Auth: APPROVED (pharmacy benefits) PA# Valid: 08/16/21-08/15/22    ** This summary of benefits is an estimation of the patient's out-of-pocket cost. Exact cost may very based on individual plan coverage.

## 2022-05-29 ENCOUNTER — Other Ambulatory Visit (HOSPITAL_BASED_OUTPATIENT_CLINIC_OR_DEPARTMENT_OTHER): Payer: Self-pay

## 2022-06-05 ENCOUNTER — Telehealth: Payer: Self-pay | Admitting: Family Medicine

## 2022-06-05 ENCOUNTER — Other Ambulatory Visit (HOSPITAL_BASED_OUTPATIENT_CLINIC_OR_DEPARTMENT_OTHER): Payer: Self-pay

## 2022-06-13 ENCOUNTER — Other Ambulatory Visit (HOSPITAL_BASED_OUTPATIENT_CLINIC_OR_DEPARTMENT_OTHER): Payer: Self-pay

## 2022-06-14 ENCOUNTER — Ambulatory Visit (INDEPENDENT_AMBULATORY_CARE_PROVIDER_SITE_OTHER): Payer: 59

## 2022-06-14 ENCOUNTER — Other Ambulatory Visit (HOSPITAL_BASED_OUTPATIENT_CLINIC_OR_DEPARTMENT_OTHER): Payer: Self-pay

## 2022-06-14 DIAGNOSIS — M810 Age-related osteoporosis without current pathological fracture: Secondary | ICD-10-CM

## 2022-06-14 MED ORDER — DENOSUMAB 60 MG/ML ~~LOC~~ SOSY
60.0000 mg | PREFILLED_SYRINGE | Freq: Once | SUBCUTANEOUS | Status: AC
  Administered 2022-06-14: 60 mg via SUBCUTANEOUS

## 2022-06-14 NOTE — Progress Notes (Signed)
Pt came in for her Prolia shot today. She has pain her $50 copay.  Pt came in with the med from the pharmacy and Sub Q in left arm. Pt tolerated well.

## 2022-06-18 NOTE — Telephone Encounter (Signed)
Last Prolia inj 06/14/22 Next Prolia inj due 12/14/22

## 2022-08-29 NOTE — Telephone Encounter (Signed)
error 

## 2022-09-18 ENCOUNTER — Other Ambulatory Visit: Payer: Self-pay | Admitting: Family Medicine

## 2022-09-18 ENCOUNTER — Other Ambulatory Visit: Payer: Self-pay | Admitting: Cardiology

## 2022-09-18 DIAGNOSIS — R928 Other abnormal and inconclusive findings on diagnostic imaging of breast: Secondary | ICD-10-CM

## 2022-09-19 ENCOUNTER — Other Ambulatory Visit (HOSPITAL_COMMUNITY): Payer: Self-pay

## 2022-09-19 MED ORDER — LISINOPRIL 20 MG PO TABS
20.0000 mg | ORAL_TABLET | Freq: Every day | ORAL | 0 refills | Status: DC
  Filled 2022-09-19: qty 30, 30d supply, fill #0

## 2022-09-19 NOTE — Telephone Encounter (Signed)
Forwarding to Rx Prior Auth Team 

## 2022-10-16 ENCOUNTER — Other Ambulatory Visit: Payer: Self-pay | Admitting: Cardiology

## 2022-10-16 ENCOUNTER — Other Ambulatory Visit (HOSPITAL_COMMUNITY): Payer: Self-pay

## 2022-10-16 MED ORDER — LISINOPRIL 20 MG PO TABS
20.0000 mg | ORAL_TABLET | Freq: Every day | ORAL | 0 refills | Status: DC
  Filled 2022-10-16: qty 30, 30d supply, fill #0

## 2022-10-17 ENCOUNTER — Ambulatory Visit
Admission: RE | Admit: 2022-10-17 | Discharge: 2022-10-17 | Disposition: A | Discharge status: Home / Self Care | Payer: Commercial Managed Care - PPO | Source: Ambulatory Visit | Attending: Family Medicine | Admitting: Family Medicine

## 2022-10-17 ENCOUNTER — Other Ambulatory Visit: Payer: Self-pay | Admitting: Family Medicine

## 2022-10-17 DIAGNOSIS — R928 Other abnormal and inconclusive findings on diagnostic imaging of breast: Secondary | ICD-10-CM

## 2022-10-17 DIAGNOSIS — R922 Inconclusive mammogram: Secondary | ICD-10-CM | POA: Diagnosis not present

## 2022-10-17 DIAGNOSIS — N6311 Unspecified lump in the right breast, upper outer quadrant: Secondary | ICD-10-CM | POA: Diagnosis not present

## 2022-10-18 ENCOUNTER — Other Ambulatory Visit: Payer: Self-pay

## 2022-10-18 ENCOUNTER — Other Ambulatory Visit (HOSPITAL_COMMUNITY): Payer: Self-pay

## 2022-10-19 ENCOUNTER — Other Ambulatory Visit (HOSPITAL_COMMUNITY): Payer: Self-pay

## 2022-12-06 ENCOUNTER — Encounter: Payer: Self-pay | Admitting: Family Medicine

## 2022-12-06 ENCOUNTER — Telehealth: Payer: Self-pay

## 2022-12-06 DIAGNOSIS — Z131 Encounter for screening for diabetes mellitus: Secondary | ICD-10-CM

## 2022-12-06 DIAGNOSIS — Z1329 Encounter for screening for other suspected endocrine disorder: Secondary | ICD-10-CM

## 2022-12-06 DIAGNOSIS — R5383 Other fatigue: Secondary | ICD-10-CM

## 2022-12-06 DIAGNOSIS — I10 Essential (primary) hypertension: Secondary | ICD-10-CM

## 2022-12-06 DIAGNOSIS — E785 Hyperlipidemia, unspecified: Secondary | ICD-10-CM

## 2022-12-06 DIAGNOSIS — I2510 Atherosclerotic heart disease of native coronary artery without angina pectoris: Secondary | ICD-10-CM

## 2022-12-06 NOTE — Telephone Encounter (Signed)
Pt last Prolia was 06/14/22 Due on/after 12/13/22  Please check EOB.

## 2022-12-07 ENCOUNTER — Encounter: Payer: Self-pay | Admitting: Family Medicine

## 2022-12-08 ENCOUNTER — Telehealth: Payer: Self-pay

## 2022-12-08 NOTE — Telephone Encounter (Signed)
Created new encounter for Prolia BIV. Will route encounter back once benefit verification is complete.  

## 2022-12-08 NOTE — Telephone Encounter (Signed)
Prolia VOB initiated via [email or web address]  Last Prolia inj: 06/14/22 Next Prolia inj DUE: 12/13/22

## 2022-12-11 NOTE — Telephone Encounter (Signed)
PA submitted via [employer]  Authorization Number : [id number]

## 2022-12-13 NOTE — Telephone Encounter (Signed)
CASE NUMBER: [id number]

## 2022-12-14 ENCOUNTER — Other Ambulatory Visit (HOSPITAL_COMMUNITY): Payer: Self-pay

## 2022-12-14 NOTE — Telephone Encounter (Signed)
PA submitted for pharmacy benefits via CMM  Key: [id number]  Status pending

## 2022-12-19 ENCOUNTER — Other Ambulatory Visit: Payer: Self-pay | Admitting: Cardiology

## 2022-12-19 ENCOUNTER — Other Ambulatory Visit (HOSPITAL_COMMUNITY): Payer: Self-pay

## 2022-12-19 MED ORDER — LISINOPRIL 20 MG PO TABS
20.0000 mg | ORAL_TABLET | Freq: Every day | ORAL | 0 refills | Status: DC
  Filled 2022-12-19: qty 30, 30d supply, fill #0

## 2022-12-19 NOTE — Telephone Encounter (Signed)
Patient Advocate Encounter  Prior Authorization for Prolia has been approved with [employer].    PA# [id number] Effective dates: 12/14/22 through 12/13/23  Per [facility] test claim, copay for 180 days supply is $750  This medication must be filled at [facility].

## 2022-12-19 NOTE — Telephone Encounter (Signed)
Pt ready for scheduling for Prolia on or after : 12/19/22  Out-of-pocket cost due at time of visit: $100  Primary: [employer] Prolia co-insurance: $50 Admin fee co-insurance: $50  Secondary: --- Prolia co-insurance:  Admin fee co-insurance:   Medical Benefit Details: Date Benefits were checked: 12/08/22 Deductible: no/ Coinsurance: $50/ Admin Fee: $50  Prior Auth: APPROVED PA# [id number]  Expiration Date: 12/10/23   Pharmacy benefit: Copay $750 ([id number], expiration date: 12/13/23) If patient wants fill through the pharmacy benefit please send prescription to:  [facility] , and include estimated need by date in rx notes. Pharmacy will ship medication directly to the office.  Patient IS eligible for Prolia Copay Card. Copay Card can make patient's cost as little as $25. Link to apply: [email or web address]://[email or web address]  ** This summary of benefits is an estimation of the patient's out-of-pocket cost. Exact cost may very based on individual plan coverage.

## 2022-12-19 NOTE — Telephone Encounter (Signed)
MyChart message sent to the pt.

## 2022-12-20 ENCOUNTER — Other Ambulatory Visit (HOSPITAL_COMMUNITY): Payer: Self-pay

## 2022-12-26 ENCOUNTER — Other Ambulatory Visit (HOSPITAL_COMMUNITY): Payer: Self-pay

## 2022-12-27 ENCOUNTER — Ambulatory Visit: Payer: Commercial Managed Care - PPO | Admitting: Family Medicine

## 2023-01-09 DIAGNOSIS — D3131 Benign neoplasm of right choroid: Secondary | ICD-10-CM | POA: Diagnosis not present

## 2023-01-10 NOTE — Patient Instructions (Signed)
It was great to see you again today, I will be in touch with your labs as soon as possible Prolia today- perhaps let's do your bone density after your 3rd shot  Let me know when you would like me to order this   Pneumonia vaccine today

## 2023-01-10 NOTE — Progress Notes (Signed)
[facility] at [facility] [address] [place], [place] [address] [phone]  Date:  01/17/2023   Name:  GAYLE MARTINEZ   DOB:  June 21, 1956   MRN:  [id number]  PCP:  Pearline Cables, MD    Chief Complaint: Annual Exam (Concerns/ questions: Marliss Czar vaccine due/Prolia due today)   History of Present Illness:  Patricia Rush is a 67 y.o. very pleasant female patient who presents with the following:  Patient seen today for physical exam and Prolia-most recent injection November 2023 Most recent visit with myself was 1 year ago History of hypertension, dyslipidemia, CAD  Works as a Teacher, early years/pre, married to [place].  She has an adult daughter, she is doing well   Pap completed last June, negative Mammogram 3/24-recommended screening in 1 year DEXA 3/22-osteoporosis.  Today will be her second dose of Prolia.  Perhaps plan to do a bone density in another 6 months Colonoscopy completed last fall Can give pneumonia vaccine today- give dose of prenar 20   Seen by cardiology 3/23: Essential hypertension blood pressure perfectly controlled we will continue present management. Dyslipidemia I did review her K PN which show me LDL of 65 HDL 60 this is from September 2022 we will continue present management which include high intense statin in form of Lipitor 80. Coronary artery disease likely hemodynamically insignificant and asymptomatic.  The key is risk factors modifications, she is on antiplatelet therapy she is on statin blood pressure is controlled we did talk about healthy lifestyle need to exercise on the regular basis as well as we discussed basic of Mediterranean diet which she is trying to do.  Aspirin 81 Lipitor 80 Calcium and vitamin D Lisinopril 20  She notes her BP tends to run a little higher after she drinks coffee  She is still working full time - she splits between [employer] and [facility]   Wt Readings from Last 3  Encounters:  01/17/23 166 lb (75.3 kg)  12/28/21 169 lb 12.8 oz (77 kg)  10/11/21 171 lb (77.6 kg)   She did get down to 157 lbs Patient Active Problem List   Diagnosis Date Noted   Osteoporosis 09/30/2020   Seasonal allergies    German measles    Chicken pox    Coronary artery disease 12/12/2019   Atypical chest pain 11/12/2019   Dyslipidemia 11/12/2019   Essential hypertension 11/12/2019   Borderline blood pressure 03/17/2019    Past Medical History:  Diagnosis Date   Atypical chest pain 11/12/2019   Borderline blood pressure 03/17/2019   Chicken pox    Coronary artery disease 12/12/2019   Dyslipidemia 11/12/2019   Essential hypertension 11/12/2019   German measles    Seasonal allergies     Past Surgical History:  Procedure Laterality Date   AUGMENTATION MAMMAPLASTY     MANDIBLE SURGERY     TONSILLECTOMY      Social History   Tobacco Use   Smoking status: Never   Smokeless tobacco: Never  Substance Use Topics   Alcohol use: Never   Drug use: Never    Family History  Problem Relation Age of Onset   Alzheimer's disease Father     No Known Allergies  Medication list has been reviewed and updated.  Current Outpatient Medications on File Prior to Visit  Medication Sig Dispense Refill   aspirin EC 81 MG tablet Take 81 mg by mouth daily.     atorvastatin (LIPITOR)  80 MG tablet TAKE 1 TABLET BY MOUTH DAILY 90 tablet 3   Calcium Carbonate (CALCIUM 500 PO) Take 1,000 mg by mouth daily.     Cholecalciferol (VITAMIN D) 50 MCG (2000 UT) CAPS Take 2,000 Units by mouth daily.     clobetasol ointment (TEMOVATE) 0.05 % Apply topically twice a day for up to 2-3 weeks 30 g 0   nitroGLYCERIN (NITROSTAT) 0.4 MG SL tablet Place 1 tablet (0.4 mg total) under the tongue every 5 (five) minutes as needed for chest pain. 25 tablet 3   No current facility-administered medications on file prior to visit.    Review of Systems:  As per HPI- otherwise negative.   Physical  Examination: Vitals:   01/17/23 0911 01/17/23 0928  BP: (!) 148/72 138/78  Pulse: 81   Resp: 18   Temp: (!) 97.1 F (36.2 C)   SpO2: 99%    Vitals:   01/17/23 0911  Weight: 166 lb (75.3 kg)  Height: 5\' 6"  (1.676 m)   Body mass index is 26.79 kg/m. Ideal Body Weight: Weight in (lb) to have BMI = 25: 154.6  GEN: no acute distress. Mild overweight, looks well  HEENT: Atraumatic, Normocephalic.  Ears and Nose: No external deformity. CV: RRR, No M/G/R. No JVD. No thrill. No extra heart sounds. PULM: CTA B, no wheezes, crackles, rhonchi. No retractions. No resp. distress. No accessory muscle use. ABD: S, NT, ND, +BS. No rebound. No HSM. EXTR: No c/c/e PSYCH: Normally interactive. Conversant.    Assessment and Plan: Physical exam  Coronary artery disease involving native coronary artery of native heart without angina pectoris  Dyslipidemia - Plan: Lipid panel  Essential hypertension - Plan: CBC, Comprehensive metabolic panel, lisinopril (ZESTRIL) 20 MG tablet  Screening for diabetes mellitus - Plan: Comprehensive metabolic panel, Hemoglobin A1c  Fatigue, unspecified type  Thyroid disorder screening - Plan: TSH  Osteoporosis, unspecified osteoporosis type, unspecified pathological fracture presence - Plan: VITAMIN D 25 Hydroxy (Vit-D Deficiency, Fractures)  Vitamin D deficiency - Plan: VITAMIN D 25 Hydroxy (Vit-D Deficiency, Fractures)  Immunization due - Plan: Pneumococcal conjugate vaccine 20-valent (Prevnar 20)  Physical exam today.  Encouraged healthy diet and exercise routine Will plan further follow- up pending labs. Follow-up on vitamin D, history of deficiency Gave pneumonia vaccine today  Signed Abbe Amsterdam, MD Received labs as below, message to patient Results for orders placed or performed in visit on 01/17/23  CBC  Result Value Ref Range   WBC 7.9 4.0 - 10.5 K/uL   RBC 4.75 3.87 - 5.11 Mil/uL   Platelets 276.0 150.0 - 400.0 K/uL   Hemoglobin  13.8 12.0 - 15.0 g/dL   HCT 16.1 09.6 - 04.5 %   MCV 88.7 78.0 - 100.0 fl   MCHC 32.8 30.0 - 36.0 g/dL   RDW 40.9 81.1 - 91.4 %  Comprehensive metabolic panel  Result Value Ref Range   Sodium 141 135 - 145 mEq/L   Potassium 4.4 3.5 - 5.1 mEq/L   Chloride 106 96 - 112 mEq/L   CO2 28 19 - 32 mEq/L   Glucose, Bld 85 70 - 99 mg/dL   BUN 23 6 - 23 mg/dL   Creatinine, Ser 7.82 0.40 - 1.20 mg/dL   Total Bilirubin 1.2 0.2 - 1.2 mg/dL   Alkaline Phosphatase 51 39 - 117 U/L   AST 19 0 - 37 U/L   ALT 24 0 - 35 U/L   Total Protein 6.6 6.0 - 8.3 g/dL   Albumin  4.2 3.5 - 5.2 g/dL   GFR 16.10 >96.04 mL/min   Calcium 10.1 8.4 - 10.5 mg/dL  Hemoglobin V4U  Result Value Ref Range   Hgb A1c MFr Bld 5.6 4.6 - 6.5 %  Lipid panel  Result Value Ref Range   Cholesterol 140 0 - 200 mg/dL   Triglycerides 98.1 0.0 - 149.0 mg/dL   HDL 19.14 >78.29 mg/dL   VLDL 9.8 0.0 - 56.2 mg/dL   LDL Cholesterol 79 0 - 99 mg/dL   Total CHOL/HDL Ratio 3    NonHDL 88.39   TSH  Result Value Ref Range   TSH 1.92 0.35 - 5.50 uIU/mL  VITAMIN D 25 Hydroxy (Vit-D Deficiency, Fractures)  Result Value Ref Range   VITD 52.48 30.00 - 100.00 ng/mL

## 2023-01-17 ENCOUNTER — Other Ambulatory Visit (HOSPITAL_COMMUNITY): Payer: Self-pay

## 2023-01-17 ENCOUNTER — Telehealth: Payer: Self-pay

## 2023-01-17 ENCOUNTER — Encounter: Payer: Self-pay | Admitting: Family Medicine

## 2023-01-17 ENCOUNTER — Other Ambulatory Visit: Payer: Commercial Managed Care - PPO

## 2023-01-17 ENCOUNTER — Ambulatory Visit (INDEPENDENT_AMBULATORY_CARE_PROVIDER_SITE_OTHER): Payer: Commercial Managed Care - PPO | Admitting: Family Medicine

## 2023-01-17 VITALS — BP 138/78 | HR 81 | Temp 97.1°F | Resp 18 | Ht 66.0 in | Wt 166.0 lb

## 2023-01-17 DIAGNOSIS — Z131 Encounter for screening for diabetes mellitus: Secondary | ICD-10-CM

## 2023-01-17 DIAGNOSIS — Z1329 Encounter for screening for other suspected endocrine disorder: Secondary | ICD-10-CM

## 2023-01-17 DIAGNOSIS — M810 Age-related osteoporosis without current pathological fracture: Secondary | ICD-10-CM

## 2023-01-17 DIAGNOSIS — I2510 Atherosclerotic heart disease of native coronary artery without angina pectoris: Secondary | ICD-10-CM

## 2023-01-17 DIAGNOSIS — Z23 Encounter for immunization: Secondary | ICD-10-CM

## 2023-01-17 DIAGNOSIS — I10 Essential (primary) hypertension: Secondary | ICD-10-CM

## 2023-01-17 DIAGNOSIS — E785 Hyperlipidemia, unspecified: Secondary | ICD-10-CM

## 2023-01-17 DIAGNOSIS — Z0000 Encounter for general adult medical examination without abnormal findings: Secondary | ICD-10-CM | POA: Diagnosis not present

## 2023-01-17 DIAGNOSIS — E559 Vitamin D deficiency, unspecified: Secondary | ICD-10-CM | POA: Diagnosis not present

## 2023-01-17 DIAGNOSIS — R5383 Other fatigue: Secondary | ICD-10-CM

## 2023-01-17 LAB — COMPREHENSIVE METABOLIC PANEL
ALT: 24 U/L (ref 0–35)
AST: 19 U/L (ref 0–37)
Albumin: 4.2 g/dL (ref 3.5–5.2)
Alkaline Phosphatase: 51 U/L (ref 39–117)
BUN: 23 mg/dL (ref 6–23)
CO2: 28 mEq/L (ref 19–32)
Calcium: 10.1 mg/dL (ref 8.4–10.5)
Chloride: 106 mEq/L (ref 96–112)
Creatinine, Ser: 0.75 mg/dL (ref 0.40–1.20)
GFR: 83.45 mL/min (ref >60.00)
Glucose, Bld: 85 mg/dL (ref 70–99)
Potassium: 4.4 mEq/L (ref 3.5–5.1)
Sodium: 141 mEq/L (ref 135–145)
Total Bilirubin: 1.2 mg/dL (ref 0.2–1.2)
Total Protein: 6.6 g/dL (ref 6.0–8.3)

## 2023-01-17 LAB — CBC
HCT: 42.1 % (ref 36.0–46.0)
Hemoglobin: 13.8 g/dL (ref 12.0–15.0)
MCHC: 32.8 g/dL (ref 30.0–36.0)
MCV: 88.7 fl (ref 78.0–100.0)
Platelets: 276 10*3/uL (ref 150.0–400.0)
RBC: 4.75 Mil/uL (ref 3.87–5.11)
RDW: 12.9 % (ref 11.5–15.5)
WBC: 7.9 10*3/uL (ref 4.0–10.5)

## 2023-01-17 LAB — LIPID PANEL
Cholesterol: 140 mg/dL (ref 0–200)
HDL: 51.7 mg/dL (ref >39.00)
LDL Cholesterol: 79 mg/dL (ref 0–99)
NonHDL: 88.39
Total CHOL/HDL Ratio: 3
Triglycerides: 49 mg/dL (ref 0.0–149.0)
VLDL: 9.8 mg/dL (ref 0.0–40.0)

## 2023-01-17 LAB — TSH: TSH: 1.92 u[IU]/mL (ref 0.35–5.50)

## 2023-01-17 LAB — HEMOGLOBIN A1C: Hgb A1c MFr Bld: 5.6 % (ref 4.6–6.5)

## 2023-01-17 LAB — VITAMIN D 25 HYDROXY (VIT D DEFICIENCY, FRACTURES): VITD: 52.48 ng/mL (ref 30.00–100.00)

## 2023-01-17 MED ORDER — LISINOPRIL 20 MG PO TABS
20.0000 mg | ORAL_TABLET | Freq: Every day | ORAL | 3 refills | Status: DC
Start: 2023-01-17 — End: 2024-02-28
  Filled 2023-01-17: qty 90, 90d supply, fill #0
  Filled 2023-06-14 (×2): qty 90, 90d supply, fill #1
  Filled 2023-10-01: qty 90, 90d supply, fill #2

## 2023-01-17 MED ORDER — DENOSUMAB 60 MG/ML ~~LOC~~ SOSY
60.0000 mg | PREFILLED_SYRINGE | Freq: Once | SUBCUTANEOUS | Status: AC
Start: 2023-01-17 — End: 2023-01-17
  Administered 2023-01-17: 60 mg via SUBCUTANEOUS

## 2023-01-17 NOTE — Telephone Encounter (Signed)
Pt received Prolia injection today. Due on/after 07/19/23.

## 2023-01-18 ENCOUNTER — Other Ambulatory Visit (HOSPITAL_COMMUNITY): Payer: Self-pay

## 2023-02-07 DIAGNOSIS — H43813 Vitreous degeneration, bilateral: Secondary | ICD-10-CM | POA: Diagnosis not present

## 2023-02-07 DIAGNOSIS — D3131 Benign neoplasm of right choroid: Secondary | ICD-10-CM | POA: Diagnosis not present

## 2023-02-07 DIAGNOSIS — D3132 Benign neoplasm of left choroid: Secondary | ICD-10-CM | POA: Diagnosis not present

## 2023-02-07 DIAGNOSIS — H35363 Drusen (degenerative) of macula, bilateral: Secondary | ICD-10-CM | POA: Diagnosis not present

## 2023-02-07 DIAGNOSIS — H35033 Hypertensive retinopathy, bilateral: Secondary | ICD-10-CM | POA: Diagnosis not present

## 2023-02-22 ENCOUNTER — Other Ambulatory Visit: Payer: Self-pay | Admitting: Oncology

## 2023-02-22 DIAGNOSIS — Z006 Encounter for examination for normal comparison and control in clinical research program: Secondary | ICD-10-CM

## 2023-02-23 ENCOUNTER — Encounter: Payer: Self-pay | Admitting: Cardiology

## 2023-02-27 ENCOUNTER — Encounter: Payer: Self-pay | Admitting: Cardiology

## 2023-02-27 ENCOUNTER — Ambulatory Visit: Payer: Commercial Managed Care - PPO | Attending: Cardiology | Admitting: Cardiology

## 2023-02-27 VITALS — BP 118/68 | HR 80 | Ht 66.0 in | Wt 165.0 lb

## 2023-02-27 DIAGNOSIS — I10 Essential (primary) hypertension: Secondary | ICD-10-CM | POA: Diagnosis not present

## 2023-02-27 DIAGNOSIS — E785 Hyperlipidemia, unspecified: Secondary | ICD-10-CM

## 2023-02-27 DIAGNOSIS — I2510 Atherosclerotic heart disease of native coronary artery without angina pectoris: Secondary | ICD-10-CM | POA: Diagnosis not present

## 2023-02-27 NOTE — Patient Instructions (Addendum)
Medication Instructions:  Your physician recommends that you continue on your current medications as directed. Please refer to the Current Medication list given to you today.  *If you need a refill on your cardiac medications before your next appointment, please call your pharmacy*   Lab Work: 3rd Floor    [address]   Your physician recommends that you return for lab work in: 3-6 months  You need to have labs done when you are fasting.  You can come Monday through Friday 8:00 am to 11:30am and 1:00 to 4:00. You do not need to make an appointment as the order has already been placed. The labs you are going to have done are Lipid profile   Testing/Procedures: None Ordered   Follow-Up: At [facility], you and your health needs are our priority.  As part of our continuing mission to provide you with exceptional heart care, we have created designated Provider Care Teams.  These Care Teams include your primary Cardiologist (physician) and Advanced Practice Providers (APPs -  Physician Assistants and Nurse Practitioners) who all work together to provide you with the care you need, when you need it.  We recommend signing up for the patient portal called "MyChart".  Sign up information is provided on this After Visit Summary.  MyChart is used to connect with patients for Virtual Visits (Telemedicine).  Patients are able to view lab/test results, encounter notes, upcoming appointments, etc.  Non-urgent messages can be sent to your provider as well.   To learn more about what you can do with MyChart, go to [email or web address].    Your next appointment:   12 month(s)  The format for your next appointment:   In Person  Provider:   Gypsy Balsam, MD    Other Instructions NA

## 2023-02-27 NOTE — Progress Notes (Unsigned)
Cardiology Office Note:    Date:  02/27/2023   ID:  JSSICA MELESIO, DOB May 18, 1956, MRN [id number]  PCP:  Pearline Cables, MD  Cardiologist:  Gypsy Balsam, MD    Referring MD: Pearline Cables, MD   Chief Complaint  Patient presents with   Follow-up    History of Present Illness:    JUANIKA ZAHM is a 67 y.o. female  with past medical history significant for dyslipidemia, coronary artery disease. She did have coronary CT angio done in December 2020 which showed multiple, likely hemodynamically insignificant lesions probably the tightest lesion was in the proximal LAD after that she had a fractional flow reserve checked which was negative for hemodynamically significant lesions. The key is risk factors modifications. She does have borderline hypertension but also dyslipidemia both manage appropriately.  Constipation biopsy follow-up.  Overall she is doing very well.  She denies having chest pain tightness squeezing pressure burning chest, still working hard still taking care of her elderly mother.  Past Medical History:  Diagnosis Date   Atypical chest pain 11/12/2019   Borderline blood pressure 03/17/2019   Chicken pox    Coronary artery disease 12/12/2019   Dyslipidemia 11/12/2019   Essential hypertension 11/12/2019   German measles    Seasonal allergies     Past Surgical History:  Procedure Laterality Date   AUGMENTATION MAMMAPLASTY     MANDIBLE SURGERY     TONSILLECTOMY      Current Medications: Current Meds  Medication Sig   aspirin EC 81 MG tablet Take 81 mg by mouth daily.   atorvastatin (LIPITOR) 80 MG tablet TAKE 1 TABLET BY MOUTH DAILY (Patient taking differently: Take 80 mg by mouth daily.)   Calcium Carbonate (CALCIUM 500 PO) Take 1,000 mg by mouth daily.   Cholecalciferol (VITAMIN D) 50 MCG (2000 UT) CAPS Take 2,000 Units by mouth daily.   clobetasol ointment (TEMOVATE) 0.05 % Apply topically twice a day for up to 2-3 weeks (Patient taking differently:  Apply 1 Application topically 2 (two) times daily.)   lisinopril (ZESTRIL) 20 MG tablet Take 1 tablet (20 mg total) by mouth daily. Patient needs appointment for further refills.   nitroGLYCERIN (NITROSTAT) 0.4 MG SL tablet Place 1 tablet (0.4 mg total) under the tongue every 5 (five) minutes as needed for chest pain.     Allergies:   Patient has no known allergies.   Social History   Socioeconomic History   Marital status: Married    Spouse name: Not on file   Number of children: Not on file   Years of education: Not on file   Highest education level: Not on file  Occupational History   Not on file  Tobacco Use   Smoking status: Never   Smokeless tobacco: Never  Substance and Sexual Activity   Alcohol use: Never   Drug use: Never   Sexual activity: Yes    Birth control/protection: None  Other Topics Concern   Not on file  Social History Narrative   Not on file   Social Determinants of Health   Financial Resource Strain: Not on file  Food Insecurity: Not on file  Transportation Needs: Not on file  Physical Activity: Not on file  Stress: Not on file  Social Connections: Not on file     Family History: The patient's family history includes Alzheimer's disease in her father. ROS:   Please see the history of present illness.    All 14 point review of systems  negative except as described per history of present illness  EKGs/Labs/Other Studies Reviewed:    EKG Interpretation Date/Time:  Tuesday February 27 2023 15:41:31 EDT Ventricular Rate:  78 PR Interval:  146 QRS Duration:  76 QT Interval:  364 QTC Calculation: 414 R Axis:   79  Text Interpretation: Normal sinus rhythm Normal ECG When compared with ECG of 26-Apr-2020 20:55, Fusion complexes are no longer Present Nonspecific T wave abnormality no longer evident in Anterior leads Confirmed by Gypsy Balsam [phone]) on 02/27/2023 3:46:36 PM    Recent Labs: 01/17/2023: ALT 24; BUN 23; Creatinine, Ser 0.75;  Hemoglobin 13.8; Platelets 276.0; Potassium 4.4; Sodium 141; TSH 1.92  Recent Lipid Panel    Component Value Date/Time   CHOL 140 01/17/2023 0943   CHOL 137 04/14/2021 1019   TRIG 49.0 01/17/2023 0943   HDL 51.70 01/17/2023 0943   HDL 60 04/14/2021 1019   CHOLHDL 3 01/17/2023 0943   VLDL 9.8 01/17/2023 0943   LDLCALC 79 01/17/2023 0943   LDLCALC 65 04/14/2021 1019    Physical Exam:    VS:  BP 118/68 (BP Location: Left Arm, Patient Position: Sitting)   Pulse 80   Ht 5\' 6"  (1.676 m)   Wt 165 lb (74.8 kg)   SpO2 97%   BMI 26.63 kg/m     Wt Readings from Last 3 Encounters:  02/27/23 165 lb (74.8 kg)  01/17/23 166 lb (75.3 kg)  12/28/21 169 lb 12.8 oz (77 kg)     GEN:  Well nourished, well developed in no acute distress HEENT: Normal NECK: No JVD; No carotid bruits LYMPHATICS: No lymphadenopathy CARDIAC: RRR, no murmurs, no rubs, no gallops RESPIRATORY:  Clear to auscultation without rales, wheezing or rhonchi  ABDOMEN: Soft, non-tender, non-distended MUSCULOSKELETAL:  No edema; No deformity  SKIN: Warm and dry LOWER EXTREMITIES: no swelling NEUROLOGIC:  Alert and oriented x 3 PSYCHIATRIC:  Normal affect   ASSESSMENT:    1. Essential hypertension   2. Coronary artery disease involving native coronary artery of native heart without angina pectoris   3. Dyslipidemia    PLAN:    In order of problems listed above:  Essential hypertension blood pressure well-controlled continue present management. Dyslipidemia I did review her K PN which show me her LDL increased to 79.  I told her in her clinical condition we want to have her LDL less than 70 she is already on high intense statin form of Lipitor.  I offer her Zetia however she prefers to try exercise first and modification of her diet.  I will put an order for fasting lipid profile to be checked will give her a few months to do that and then we will recheck her fasting lipid profile. Coronary disease stable asymptomatic  on antiplatelet therapy and statin. We did talk about living healthy lifestyle.  She is trying to retire hopefully she will have more time to exercise on a regular basis   Medication Adjustments/Labs and Tests Ordered: Current medicines are reviewed at length with the patient today.  Concerns regarding medicines are outlined above.  Orders Placed This Encounter  Procedures   EKG 12-Lead   Medication changes: No orders of the defined types were placed in this encounter.   Signed, Georgeanna Lea, MD, [facility] 02/27/2023 3:55 PM    [facility]

## 2023-04-16 ENCOUNTER — Other Ambulatory Visit: Payer: Self-pay | Admitting: Family Medicine

## 2023-04-16 DIAGNOSIS — Z13220 Encounter for screening for lipoid disorders: Secondary | ICD-10-CM

## 2023-04-17 ENCOUNTER — Other Ambulatory Visit (HOSPITAL_COMMUNITY): Payer: Self-pay

## 2023-04-17 MED ORDER — ATORVASTATIN CALCIUM 80 MG PO TABS
80.0000 mg | ORAL_TABLET | Freq: Every day | ORAL | 1 refills | Status: DC
Start: 2023-04-17 — End: 2023-12-03
  Filled 2023-04-17: qty 90, 90d supply, fill #0
  Filled 2023-08-08: qty 90, 90d supply, fill #1

## 2023-05-31 ENCOUNTER — Other Ambulatory Visit: Payer: Self-pay

## 2023-05-31 DIAGNOSIS — M810 Age-related osteoporosis without current pathological fracture: Secondary | ICD-10-CM

## 2023-05-31 MED ORDER — DENOSUMAB 60 MG/ML ~~LOC~~ SOSY
60.0000 mg | PREFILLED_SYRINGE | Freq: Once | SUBCUTANEOUS | Status: AC
Start: 2023-06-25 — End: 2023-07-20
  Administered 2023-07-20: 60 mg via SUBCUTANEOUS

## 2023-05-31 NOTE — Telephone Encounter (Signed)
CAM ordered

## 2023-06-05 ENCOUNTER — Telehealth: Payer: Self-pay

## 2023-06-05 NOTE — Telephone Encounter (Signed)
Prolia VOB initiated via [email or web address]  Next Prolia inj DUE: 07/19/23

## 2023-06-06 ENCOUNTER — Other Ambulatory Visit (HOSPITAL_COMMUNITY): Payer: Self-pay

## 2023-06-06 NOTE — Telephone Encounter (Signed)
Pt ready for scheduling for PROLIA on or after : 07/19/23  Out-of-pocket cost due at time of visit: $100  Primary: [employer] Prolia co-insurance: $50 Admin fee co-insurance: $50  Secondary: --- Prolia co-insurance:  Admin fee co-insurance:   Medical Benefit Details: Date Benefits were checked: 06/05/23 Deductible: NO/ Coinsurance: $50/ Admin Fee: $50  Prior Auth: APPROVED PA# [id number] (Medical) / [phone] (Pharmacy) Expiration Date: 12/11/22-12/10/23 (Medical) / 12/14/22-12/13/23 (Pharmacy)  # of doses approved: 2  Pharmacy benefit: Copay $750 If patient wants fill through the pharmacy benefit please send prescription to: [employer], and include estimated need by date in rx notes. Pharmacy will ship medication directly to the office.  Patient IS eligible for Prolia Copay Card. Copay Card can make patient's cost as little as $25. Link to apply: [email or web address]://www.amgensupportplus.com/copay  ** This summary of benefits is an estimation of the patient's out-of-pocket cost. Exact cost may very based on individual plan coverage.

## 2023-06-06 NOTE — Telephone Encounter (Signed)
Case Number: [id number] (medical)    Pharmacy:

## 2023-06-08 ENCOUNTER — Other Ambulatory Visit (HOSPITAL_COMMUNITY)
Admission: RE | Admit: 2023-06-08 | Discharge: 2023-06-08 | Disposition: A | Discharge status: Home / Self Care | Payer: Commercial Managed Care - PPO | Source: Ambulatory Visit | Attending: Oncology | Admitting: Oncology

## 2023-06-08 DIAGNOSIS — Z006 Encounter for examination for normal comparison and control in clinical research program: Secondary | ICD-10-CM | POA: Insufficient documentation

## 2023-06-09 ENCOUNTER — Emergency Department (HOSPITAL_BASED_OUTPATIENT_CLINIC_OR_DEPARTMENT_OTHER)
Admission: EM | Admit: 2023-06-09 | Discharge: 2023-06-09 | Disposition: A | Discharge status: Home / Self Care | Payer: Commercial Managed Care - PPO | Attending: Emergency Medicine | Admitting: Emergency Medicine

## 2023-06-09 ENCOUNTER — Encounter (HOSPITAL_BASED_OUTPATIENT_CLINIC_OR_DEPARTMENT_OTHER): Payer: Self-pay | Admitting: Emergency Medicine

## 2023-06-09 ENCOUNTER — Other Ambulatory Visit: Payer: Self-pay

## 2023-06-09 ENCOUNTER — Emergency Department (HOSPITAL_BASED_OUTPATIENT_CLINIC_OR_DEPARTMENT_OTHER): Payer: Commercial Managed Care - PPO

## 2023-06-09 DIAGNOSIS — I2510 Atherosclerotic heart disease of native coronary artery without angina pectoris: Secondary | ICD-10-CM | POA: Diagnosis not present

## 2023-06-09 DIAGNOSIS — R0789 Other chest pain: Secondary | ICD-10-CM | POA: Diagnosis not present

## 2023-06-09 DIAGNOSIS — Z79899 Other long term (current) drug therapy: Secondary | ICD-10-CM | POA: Diagnosis not present

## 2023-06-09 DIAGNOSIS — I10 Essential (primary) hypertension: Secondary | ICD-10-CM | POA: Insufficient documentation

## 2023-06-09 DIAGNOSIS — Z7982 Long term (current) use of aspirin: Secondary | ICD-10-CM | POA: Diagnosis not present

## 2023-06-09 DIAGNOSIS — R079 Chest pain, unspecified: Secondary | ICD-10-CM | POA: Diagnosis not present

## 2023-06-09 LAB — CBC
HCT: 40.7 % (ref 36.0–46.0)
Hemoglobin: 13.6 g/dL (ref 12.0–15.0)
MCH: 29.1 pg (ref 26.0–34.0)
MCHC: 33.4 g/dL (ref 30.0–36.0)
MCV: 87 fL (ref 80.0–100.0)
Platelets: 265 10*3/uL (ref 150–400)
RBC: 4.68 MIL/uL (ref 3.87–5.11)
RDW: 12.1 % (ref 11.5–15.5)
WBC: 8.2 10*3/uL (ref 4.0–10.5)
nRBC: 0 % (ref 0.0–0.2)

## 2023-06-09 LAB — COMPREHENSIVE METABOLIC PANEL
ALT: 26 U/L (ref 0–44)
AST: 26 U/L (ref 15–41)
Albumin: 3.7 g/dL (ref 3.5–5.0)
Alkaline Phosphatase: 44 U/L (ref 38–126)
Anion gap: 8 (ref 5–15)
BUN: 16 mg/dL (ref 8–23)
CO2: 25 mmol/L (ref 22–32)
Calcium: 9.3 mg/dL (ref 8.9–10.3)
Chloride: 103 mmol/L (ref 98–111)
Creatinine, Ser: 0.76 mg/dL (ref 0.44–1.00)
GFR, Estimated: >60 mL/min (ref >60)
Glucose, Bld: 90 mg/dL (ref 70–99)
Potassium: 3.8 mmol/L (ref 3.5–5.1)
Sodium: 136 mmol/L (ref 135–145)
Total Bilirubin: 1.6 mg/dL — ABNORMAL HIGH (ref <1.2)
Total Protein: 6.6 g/dL (ref 6.5–8.1)

## 2023-06-09 LAB — TROPONIN I (HIGH SENSITIVITY)
Troponin I (High Sensitivity): <2 ng/L (ref <18)
Troponin I (High Sensitivity): <2 ng/L (ref <18)

## 2023-06-09 NOTE — ED Triage Notes (Addendum)
Chest pain around 0900, took 2 tabs nitroglycerin and 4 Baby ASA, with no relief. Describes as pressure radiates to neck

## 2023-06-09 NOTE — ED Provider Notes (Signed)
Patricia Rush Provider Note   CSN: [id number] Arrival date & time: 06/09/23  1045     History  Chief Complaint  Patient presents with   Chest Pain    Patricia Rush is a 67 y.o. female who presents emergency department with chief complaint of chest pressure.  She has a past medical history of coronary artery disease.  Patient is followed by Dr. Bing Matter.  Patient had CT coronary study done in 2021 that showed her to be in the 90th percentile for age with moderate LAD disease however follow-up FFR CT analysis showed no significant stenosis in the vessels. Patient reports that yesterday she was at work when she had onset of chest discomfort and diaphoresis.  Patient took a single nitro glycerin with complete relief of her symptoms.  This morning she woke up feeling "fatigued, sluggish and foggy headed."  Around 9 AM she was eating breakfast when she had onset of the chest pressure again noted to be on the left side of her chest.  It began radiating into her left neck, left arm and she developed numbness and tingling in her second and third digits on the left side.  Patient states that she took 2 nitroglycerin without relief.  She also took a full dose aspirin.  Because she has persistent symptoms she presented to the emergency department for further evaluation.  Patient states that her symptoms are more mild now.  She did not have any diaphoresis, nausea, vomiting, shortness of breath.  He does have some mild lightheadedness.  She has a past medical history of hypertension, hyperlipidemia.  She has never smoked.  She has no contributing familial history.   Chest Pain      Home Medications Prior to Admission medications   Medication Sig Start Date End Date Taking? Authorizing Provider  aspirin EC 81 MG tablet Take 81 mg by mouth daily.    [provider]  atorvastatin (LIPITOR) 80 MG tablet Take 1 tablet (80 mg total) by mouth daily.  04/17/23   Copland, Gwenlyn Found, MD  Calcium Carbonate (CALCIUM 500 PO) Take 1,000 mg by mouth daily.    [provider]  Cholecalciferol (VITAMIN D) 50 MCG (2000 UT) CAPS Take 2,000 Units by mouth daily.    [provider]  clobetasol ointment (TEMOVATE) 0.05 % Apply topically twice a day for up to 2-3 weeks Patient taking differently: Apply 1 Application topically 2 (two) times daily. 03/22/22     lisinopril (ZESTRIL) 20 MG tablet Take 1 tablet (20 mg total) by mouth daily. Patient needs appointment for further refills. 01/17/23 04/18/23  Copland, Gwenlyn Found, MD  nitroGLYCERIN (NITROSTAT) 0.4 MG SL tablet Place 1 tablet (0.4 mg total) under the tongue every 5 (five) minutes as needed for chest pain. 02/06/22 02/23/23  Georgeanna Lea, MD      Allergies    Patient has no known allergies.    Review of Systems   Review of Systems  Cardiovascular:  Positive for chest pain.    Physical Exam Updated Vital Signs BP 112/61   Pulse 61   Temp 97.8 F (36.6 C) (Oral)   Resp 10   Ht 5\' 6"  (1.676 m)   Wt 73.5 kg   SpO2 100%   BMI 26.15 kg/m  Physical Exam Vitals and nursing note reviewed.  Constitutional:      General: She is not in acute distress.    Appearance: She is well-developed. She is not diaphoretic.  HENT:     Head: Normocephalic and atraumatic.     Right Ear: External ear normal.     Left Ear: External ear normal.     Nose: Nose normal.     Mouth/Throat:     Mouth: Mucous membranes are moist.  Eyes:     General: No scleral icterus.    Conjunctiva/sclera: Conjunctivae normal.  Cardiovascular:     Rate and Rhythm: Normal rate and regular rhythm.     Heart sounds: Normal heart sounds. No murmur heard.    No friction rub. No gallop.  Pulmonary:     Effort: Pulmonary effort is normal. No respiratory distress.     Breath sounds: Normal breath sounds.  Chest:     Comments: No chest wall tenderness Abdominal:     General: Bowel sounds are normal. There is no  distension.     Palpations: Abdomen is soft. There is no mass.     Tenderness: There is no abdominal tenderness. There is no guarding.  Musculoskeletal:     Cervical back: Normal range of motion.  Skin:    General: Skin is warm and dry.  Neurological:     Mental Status: She is alert and oriented to person, place, and time.  Psychiatric:        Behavior: Behavior normal.     ED Results / Procedures / Treatments   Labs (all labs ordered are listed, but only abnormal results are displayed) Labs Reviewed  COMPREHENSIVE METABOLIC PANEL - Abnormal; Notable for the following components:      Result Value   Total Bilirubin 1.6 (*)    All other components within normal limits  CBC  TROPONIN I (HIGH SENSITIVITY)  TROPONIN I (HIGH SENSITIVITY)    EKG None  Radiology DG Chest Port 1 View  Result Date: 06/09/2023 CLINICAL DATA:  Chest pain EXAM: PORTABLE CHEST 1 VIEW COMPARISON:  11/05/2019 FINDINGS: The heart size and mediastinal contours are within normal limits. Both lungs are clear. The visualized skeletal structures are unremarkable. IMPRESSION: No active disease. Electronically Signed   By: Duanne Guess D.O.   On: 06/09/2023 11:56    Procedures Procedures    Medications Ordered in ED Medications - No data to display  ED Course/ Medical Decision Making/ A&P Clinical Course as of 06/09/23 1437  Sat Jun 09, 2023  1433 Troponin I (High Sensitivity) [AH]  1433 Comprehensive metabolic panel(!) [AH]  1433 CBC Troponin is negative x 2, lab work is otherwise reassuring.  Bili elevated 1.6 but insignificant overall [AH]  1433 I ordered portable 1 view chest ray and both visualized and interpreted the x-ray which shows no acute findings [AH]  1434  EKG shows normal sinus rhythm [AH]    Clinical Course User Index [AH] Arthor Captain, PA-C                                 Medical Decision Making Given the large differential diagnosis for [name], the decision making  in this case is of high complexity.  After evaluating all of the data points in this case, the presentation of [name] is NOT consistent with Acute Coronary Syndrome (ACS) and/or myocardial ischemia, pulmonary embolism, aortic dissection; Borhaave's, significant arrythmia, pneumothorax, cardiac tamponade, or other emergent cardiopulmonary condition.  Further, the presentation of GLORIAN BOLLES is NOT consistent with pericarditis, myocarditis, cholecystitis, pancreatitis, mediastinitis, endocarditis, new valvular disease.  Additionally, the presentation  of Leo R Greenis NOT consistent with flail chest, cardiac contusion, ARDS, or significant intra-thoracic or intra-abdominal bleeding.  Moreover, this presentation is NOT consistent with pneumonia, sepsis, or pyelonephritis.  The patient has a HEART Score: 5    Strict return and follow-up precautions have been given by me personally or by detailed written instruction given verbally by nursing staff using the teach back method to the patient/family/caregiver(s).  Data Reviewed/Counseling: I have reviewed the patient's vital signs, nursing notes, and other relevant tests/information. I had a detailed discussion regarding the historical points, exam findings, and any diagnostic results supporting the discharge diagnosis. I also discussed the need for outpatient follow-up and the need to return to the ED if symptoms worsen or if there are any questions or concerns that arise at home.    Amount and/or Complexity of Data Reviewed Labs: ordered. Decision-making details documented in ED Course. Radiology: ordered. ECG/medicine tests: ordered and independent interpretation performed.    Details: ECG interpretation   Date: 06/09/2023  Rate: 76  Rhythm: normal sinus rhythm  QRS Axis: normal  Intervals: normal  ST/T Wave abnormalities: normal  Conduction Disutrbances: none  Narrative Interpretation:   Old EKG Reviewed: No significant changes  noted              Final Clinical Impression(s) / ED Diagnoses Final diagnoses:  Chest pressure    Rx / DC Orders ED Discharge Orders     None         Arthor Captain, PA-C 06/09/23 1437    Royanne Foots, DO 06/11/23 [phone]

## 2023-06-09 NOTE — Discharge Instructions (Signed)

## 2023-06-11 NOTE — Telephone Encounter (Signed)
Pt scheduled for 07/20/23

## 2023-06-14 ENCOUNTER — Other Ambulatory Visit (HOSPITAL_COMMUNITY): Payer: Self-pay

## 2023-06-14 ENCOUNTER — Other Ambulatory Visit: Payer: Self-pay

## 2023-06-16 LAB — HELIX MOLECULAR SCREEN: Genetic Analysis Overall Interpretation: NEGATIVE

## 2023-06-16 LAB — GENECONNECT MOLECULAR SCREEN

## 2023-06-19 NOTE — Telephone Encounter (Signed)
Pt scheduled  

## 2023-06-29 ENCOUNTER — Other Ambulatory Visit (HOSPITAL_COMMUNITY): Payer: Self-pay

## 2023-06-29 MED ORDER — COVID-19 MRNA VAC-TRIS(PFIZER) 30 MCG/0.3ML IM SUSY
0.3000 mL | PREFILLED_SYRINGE | Freq: Once | INTRAMUSCULAR | 0 refills | Status: AC
  Filled 2023-06-29: qty 0.3, 1d supply, fill #0

## 2023-06-29 MED ORDER — AREXVY 120 MCG/0.5ML IM SUSR
INTRAMUSCULAR | 0 refills | Status: AC
  Filled 2023-06-29: qty 0.5, 1d supply, fill #0

## 2023-06-29 MED ORDER — INFLUENZA VAC A&B SURF ANT ADJ 0.5 ML IM SUSY
PREFILLED_SYRINGE | INTRAMUSCULAR | 0 refills | Status: DC
  Filled 2023-06-29: qty 0.5, 1d supply, fill #0

## 2023-07-11 DIAGNOSIS — D3132 Benign neoplasm of left choroid: Secondary | ICD-10-CM | POA: Diagnosis not present

## 2023-07-11 DIAGNOSIS — D3131 Benign neoplasm of right choroid: Secondary | ICD-10-CM | POA: Diagnosis not present

## 2023-07-20 ENCOUNTER — Ambulatory Visit (INDEPENDENT_AMBULATORY_CARE_PROVIDER_SITE_OTHER): Payer: Commercial Managed Care - PPO

## 2023-07-20 ENCOUNTER — Telehealth: Payer: Self-pay

## 2023-07-20 DIAGNOSIS — M810 Age-related osteoporosis without current pathological fracture: Secondary | ICD-10-CM

## 2023-07-20 MED ORDER — DENOSUMAB 60 MG/ML ~~LOC~~ SOSY
60.0000 mg | PREFILLED_SYRINGE | Freq: Once | SUBCUTANEOUS | Status: AC
  Administered 2024-02-05: 60 mg via SUBCUTANEOUS

## 2023-07-20 NOTE — Telephone Encounter (Signed)
Prolia was done 07/20/2023 Next is due 01/18/2024 CAM placed.

## 2023-07-20 NOTE — Progress Notes (Signed)
Patricia Rush is a 67 y.o. female presents to the office today for Prolia: injection, per physician's orders. Prolia 60 mg SQ , was administered L arm today. Patient tolerated injection. Patient next injection due: 6 months, appt made: No- will schedule in 5 months after benifits are ran again Initial injection: NO Patient supplied: NO  DOD: J Paz  Creft, Feliberto Harts

## 2023-08-08 ENCOUNTER — Other Ambulatory Visit (HOSPITAL_COMMUNITY): Payer: Self-pay

## 2023-11-27 ENCOUNTER — Telehealth: Payer: Self-pay

## 2023-11-27 NOTE — Telephone Encounter (Signed)
 Prolia VOB initiated via [email or web address]  Next Prolia inj DUE: 01/17/24

## 2023-11-27 NOTE — Telephone Encounter (Signed)
 Patricia Rush

## 2023-11-27 NOTE — Telephone Encounter (Signed)
 PA submitted via [employer]. Authorization Number :[id number]

## 2023-11-28 ENCOUNTER — Other Ambulatory Visit (HOSPITAL_COMMUNITY): Payer: Self-pay

## 2023-11-28 NOTE — Telephone Encounter (Signed)
 Pt ready for scheduling for PROLIA  on or after : 01/17/24  Option# 1: Buy/Bill (Office supplied medication)  Out-of-pocket cost due at time of clinic visit: $100  Number of injection/visits approved: 2  Primary: [employer] Prolia  co-insurance: $50 Admin fee co-insurance: $50  Secondary: --- Prolia  co-insurance:  Admin fee co-insurance:   Medical Benefit Details: Date Benefits were checked: 11/27/23 Deductible: NO/ Coinsurance: $50/ Admin Fee: $50  Prior Auth: APPROVED PA# [id number]  Expiration Date: 01/17/24-01/15/25  # of doses approved: 2 ----------------------------------------------------------------------- Option# 2- Med Obtained from pharmacy:  Pharmacy benefit: Copay $750 (Paid to pharmacy) Admin Fee: $50 (Pay at clinic)  Prior Auth: N/A PA# Expiration Date:   # of doses approved:   If patient wants fill through the pharmacy benefit please send prescription to: [employer], and include estimated need by date in rx notes. Pharmacy will ship medication directly to the office.  Patient IS eligible for Prolia  Copay Card. Copay Card can make patient's cost as little as $25. Link to apply: [email or web address]  ** This summary of benefits is an estimation of the patient's out-of-pocket cost. Exact cost may very based on individual plan coverage.

## 2023-12-03 ENCOUNTER — Other Ambulatory Visit: Payer: Self-pay | Admitting: Family Medicine

## 2023-12-03 DIAGNOSIS — Z13220 Encounter for screening for lipoid disorders: Secondary | ICD-10-CM

## 2023-12-04 ENCOUNTER — Other Ambulatory Visit (HOSPITAL_COMMUNITY): Payer: Self-pay

## 2023-12-04 MED ORDER — ATORVASTATIN CALCIUM 80 MG PO TABS
80.0000 mg | ORAL_TABLET | Freq: Every day | ORAL | 0 refills | Status: DC
Start: 2023-12-04 — End: 2024-02-28
  Filled 2023-12-04: qty 90, 90d supply, fill #0

## 2024-01-11 DIAGNOSIS — E785 Hyperlipidemia, unspecified: Secondary | ICD-10-CM | POA: Diagnosis not present

## 2024-01-12 LAB — LIPID PANEL
Chol/HDL Ratio: 2.6 ratio (ref 0.0–4.4)
Cholesterol, Total: 135 mg/dL (ref 100–199)
HDL: 52 mg/dL (ref >39)
LDL Chol Calc (NIH): 73 mg/dL (ref 0–99)
Triglycerides: 41 mg/dL (ref 0–149)
VLDL Cholesterol Cal: 10 mg/dL (ref 5–40)

## 2024-01-13 ENCOUNTER — Ambulatory Visit: Payer: Self-pay | Admitting: Cardiology

## 2024-01-16 DIAGNOSIS — H0102B Squamous blepharitis left eye, upper and lower eyelids: Secondary | ICD-10-CM | POA: Diagnosis not present

## 2024-01-16 DIAGNOSIS — H2513 Age-related nuclear cataract, bilateral: Secondary | ICD-10-CM | POA: Diagnosis not present

## 2024-01-16 DIAGNOSIS — H5213 Myopia, bilateral: Secondary | ICD-10-CM | POA: Diagnosis not present

## 2024-01-16 DIAGNOSIS — H524 Presbyopia: Secondary | ICD-10-CM | POA: Diagnosis not present

## 2024-01-16 DIAGNOSIS — H43813 Vitreous degeneration, bilateral: Secondary | ICD-10-CM | POA: Diagnosis not present

## 2024-01-16 DIAGNOSIS — H0102A Squamous blepharitis right eye, upper and lower eyelids: Secondary | ICD-10-CM | POA: Diagnosis not present

## 2024-01-16 DIAGNOSIS — H52223 Regular astigmatism, bilateral: Secondary | ICD-10-CM | POA: Diagnosis not present

## 2024-01-22 ENCOUNTER — Telehealth: Payer: Self-pay

## 2024-01-22 NOTE — Telephone Encounter (Signed)
 Left message on My Chart with lab results per Dr. Vanetta Shawl note. Routed to PCP.

## 2024-01-24 ENCOUNTER — Telehealth: Payer: Self-pay

## 2024-01-24 NOTE — Telephone Encounter (Signed)
 Pt viewed lab results on My Chart per Dr. Vanetta Shawl note. Routed to PCP.

## 2024-02-01 ENCOUNTER — Other Ambulatory Visit: Payer: Self-pay

## 2024-02-01 ENCOUNTER — Other Ambulatory Visit: Payer: Self-pay | Admitting: Family Medicine

## 2024-02-01 DIAGNOSIS — I10 Essential (primary) hypertension: Secondary | ICD-10-CM

## 2024-02-05 ENCOUNTER — Ambulatory Visit (INDEPENDENT_AMBULATORY_CARE_PROVIDER_SITE_OTHER)

## 2024-02-05 DIAGNOSIS — M810 Age-related osteoporosis without current pathological fracture: Secondary | ICD-10-CM | POA: Diagnosis not present

## 2024-02-05 MED ORDER — DENOSUMAB 60 MG/ML ~~LOC~~ SOSY
60.0000 mg | PREFILLED_SYRINGE | SUBCUTANEOUS | Status: AC
  Administered 2024-08-19: 60 mg via SUBCUTANEOUS

## 2024-02-05 NOTE — Progress Notes (Signed)
 Pt was in office today for a Prolia  injection, injection was given in L arm subcutaneous. Pt tolerated well.

## 2024-02-07 ENCOUNTER — Other Ambulatory Visit: Payer: Self-pay | Admitting: Family Medicine

## 2024-02-07 DIAGNOSIS — Z13220 Encounter for screening for lipoid disorders: Secondary | ICD-10-CM

## 2024-02-07 DIAGNOSIS — I10 Essential (primary) hypertension: Secondary | ICD-10-CM

## 2024-02-08 ENCOUNTER — Other Ambulatory Visit: Payer: Self-pay

## 2024-02-15 ENCOUNTER — Other Ambulatory Visit (HOSPITAL_COMMUNITY): Payer: Self-pay

## 2024-02-26 NOTE — Progress Notes (Unsigned)
 [facility] [address] [place], [place] [address] [phone]  Date:  02/28/2024   Name:  Patricia Rush   DOB:  01-31-1956   MRN:  [id number]  PCP:  Watt Harlene BROCKS, MD    Chief Complaint: No chief complaint on file.   History of Present Illness:  Patricia Rush is a 68 y.o. very pleasant female patient who presents with the following:  Patient seen today for physical exam.  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  She has an adult daughter, she is doing well   Samaya also follows with cardiology; cholesterol was checked earlier this year  Mammogram can be updated She had a positive Cologuard in 2023-colonoscopy performed later the same year.  7-year repeat Pap smear 6/23, normal.  She did have ASCUS with negative HPV in 2019-would recommend doing 1 more Pap for her next year Aspirin 81 Atorvastatin  80 Calcium , vitamin D  Prolia  Lisinopril  20 mg daily  Patient Active Problem List   Diagnosis Date Noted   Osteoporosis 09/30/2020   Seasonal allergies    German measles    Chicken pox    Coronary artery disease 12/12/2019   Atypical chest pain 11/12/2019   Dyslipidemia 11/12/2019   Essential hypertension 11/12/2019   Borderline blood pressure 03/17/2019    Past Medical History:  Diagnosis Date   Atypical chest pain 11/12/2019   Borderline blood pressure 03/17/2019   Chicken pox    Coronary artery disease 12/12/2019   Dyslipidemia 11/12/2019   Essential hypertension 11/12/2019   German measles    Seasonal allergies     Past Surgical History:  Procedure Laterality Date   AUGMENTATION MAMMAPLASTY     MANDIBLE SURGERY     TONSILLECTOMY      Social History   Tobacco Use   Smoking status: Never   Smokeless tobacco: Never   Substance Use Topics   Alcohol use: Never   Drug use: Never    Family History  Problem Relation Age of Onset   Alzheimer's disease Father     No Known Allergies  Medication list has been reviewed and updated.  Current Outpatient Medications on File Prior to Visit  Medication Sig Dispense Refill   aspirin EC 81 MG tablet Take 81 mg by mouth daily.     atorvastatin  (LIPITOR) 80 MG tablet Take 1 tablet (80 mg total) by mouth daily. 90 tablet 0   Calcium  Carbonate (CALCIUM  500 PO) Take 1,000 mg by mouth daily.     Cholecalciferol (VITAMIN D ) 50 MCG (2000 UT) CAPS Take 2,000 Units by mouth daily.     clobetasol  ointment (TEMOVATE ) 0.05 % Apply topically twice a day for up to 2-3 weeks (Patient taking differently: Apply 1 Application topically 2 (two) times daily.) 30 g 0   lisinopril  (ZESTRIL ) 20 MG tablet Take 1 tablet (20 mg total) by mouth daily. Patient needs appointment for further refills. 90 tablet 3   nitroGLYCERIN  (NITROSTAT ) 0.4 MG SL tablet Place 1 tablet (0.4 mg total) under the tongue every 5 (five) minutes as needed for chest pain. 25 tablet 3   RSV vaccine recomb adjuvanted (AREXVY ) 120 MCG/0.5ML injection Inject into the muscle. 0.5 mL 0   Current Facility-Administered Medications on File Prior to Visit  Medication  Dose Route Frequency Provider Last Rate Last Admin   [START ON 08/03/2024] denosumab  (PROLIA ) injection 60 mg  60 mg Subcutaneous Q6 months Vir Whetstine, Harlene BROCKS, MD        Review of Systems:  As per HPI- otherwise negative.   Physical Examination: There were no vitals filed for this visit. There were no vitals filed for this visit. There is no height or weight on file to calculate BMI. Ideal Body Weight:    GEN: no acute distress. HEENT: Atraumatic, Normocephalic.  Ears and Nose: No external deformity. CV: RRR, No M/G/R. No JVD. No thrill. No extra heart sounds. PULM: CTA B, no wheezes, crackles, rhonchi. No retractions. No resp. distress. No  accessory muscle use. ABD: S, NT, ND, +BS. No rebound. No HSM. EXTR: No c/c/e PSYCH: Normally interactive. Conversant.    Assessment and Plan: ***  Signed Harlene Schroeder, MD

## 2024-02-26 NOTE — Patient Instructions (Incomplete)
 It was great to see you again today, I will be in touch with your labs Repeat pap once more next year Ordered bone density that you can schedule with your mammogram later this year

## 2024-02-28 ENCOUNTER — Encounter: Payer: Self-pay | Admitting: Family Medicine

## 2024-02-28 ENCOUNTER — Ambulatory Visit (INDEPENDENT_AMBULATORY_CARE_PROVIDER_SITE_OTHER): Admitting: Family Medicine

## 2024-02-28 ENCOUNTER — Other Ambulatory Visit (HOSPITAL_COMMUNITY): Payer: Self-pay

## 2024-02-28 VITALS — BP 120/70 | HR 83 | Temp 98.6°F | Ht 66.0 in | Wt 159.0 lb

## 2024-02-28 DIAGNOSIS — I10 Essential (primary) hypertension: Secondary | ICD-10-CM

## 2024-02-28 DIAGNOSIS — Z0000 Encounter for general adult medical examination without abnormal findings: Secondary | ICD-10-CM | POA: Diagnosis not present

## 2024-02-28 DIAGNOSIS — E785 Hyperlipidemia, unspecified: Secondary | ICD-10-CM

## 2024-02-28 DIAGNOSIS — Z1329 Encounter for screening for other suspected endocrine disorder: Secondary | ICD-10-CM

## 2024-02-28 DIAGNOSIS — Z131 Encounter for screening for diabetes mellitus: Secondary | ICD-10-CM | POA: Diagnosis not present

## 2024-02-28 DIAGNOSIS — I2510 Atherosclerotic heart disease of native coronary artery without angina pectoris: Secondary | ICD-10-CM

## 2024-02-28 DIAGNOSIS — Z13220 Encounter for screening for lipoid disorders: Secondary | ICD-10-CM

## 2024-02-28 DIAGNOSIS — M810 Age-related osteoporosis without current pathological fracture: Secondary | ICD-10-CM

## 2024-02-28 MED ORDER — ATORVASTATIN CALCIUM 80 MG PO TABS
80.0000 mg | ORAL_TABLET | Freq: Every day | ORAL | 3 refills | Status: AC
  Filled 2024-02-28: qty 90, 90d supply, fill #0
  Filled 2024-06-16: qty 90, 90d supply, fill #1

## 2024-02-28 MED ORDER — LISINOPRIL 20 MG PO TABS
20.0000 mg | ORAL_TABLET | Freq: Every day | ORAL | 3 refills | Status: AC
  Filled 2024-02-28: qty 90, 90d supply, fill #0
  Filled 2024-06-16: qty 90, 90d supply, fill #1

## 2024-02-29 ENCOUNTER — Encounter: Payer: Self-pay | Admitting: Family Medicine

## 2024-02-29 DIAGNOSIS — R7303 Prediabetes: Secondary | ICD-10-CM

## 2024-02-29 LAB — COMPREHENSIVE METABOLIC PANEL WITH GFR
ALT: 27 U/L (ref 0–35)
AST: 25 U/L (ref 0–37)
Albumin: 4.5 g/dL (ref 3.5–5.2)
Alkaline Phosphatase: 50 U/L (ref 39–117)
BUN: 18 mg/dL (ref 6–23)
CO2: 28 meq/L (ref 19–32)
Calcium: 9.5 mg/dL (ref 8.4–10.5)
Chloride: 102 meq/L (ref 96–112)
Creatinine, Ser: 0.72 mg/dL (ref 0.40–1.20)
GFR: 86.96 mL/min (ref >60.00)
Glucose, Bld: 87 mg/dL (ref 70–99)
Potassium: 4.5 meq/L (ref 3.5–5.1)
Sodium: 140 meq/L (ref 135–145)
Total Bilirubin: 1.2 mg/dL (ref 0.2–1.2)
Total Protein: 6.7 g/dL (ref 6.0–8.3)

## 2024-02-29 LAB — CBC
HCT: 43.4 % (ref 36.0–46.0)
Hemoglobin: 14.4 g/dL (ref 12.0–15.0)
MCHC: 33.2 g/dL (ref 30.0–36.0)
MCV: 88.5 fl (ref 78.0–100.0)
Platelets: 254 K/uL (ref 150.0–400.0)
RBC: 4.9 Mil/uL (ref 3.87–5.11)
RDW: 12.6 % (ref 11.5–15.5)
WBC: 9.7 K/uL (ref 4.0–10.5)

## 2024-02-29 LAB — TSH: TSH: 3.1 u[IU]/mL (ref 0.35–5.50)

## 2024-02-29 LAB — HEMOGLOBIN A1C: Hgb A1c MFr Bld: 6.1 % (ref 4.6–6.5)

## 2024-03-05 ENCOUNTER — Other Ambulatory Visit (HOSPITAL_BASED_OUTPATIENT_CLINIC_OR_DEPARTMENT_OTHER): Payer: Self-pay | Admitting: Family Medicine

## 2024-03-05 DIAGNOSIS — Z1231 Encounter for screening mammogram for malignant neoplasm of breast: Secondary | ICD-10-CM

## 2024-03-06 ENCOUNTER — Other Ambulatory Visit (HOSPITAL_COMMUNITY): Payer: Self-pay

## 2024-03-06 DIAGNOSIS — R7303 Prediabetes: Secondary | ICD-10-CM | POA: Insufficient documentation

## 2024-03-06 MED ORDER — METFORMIN HCL 500 MG PO TABS
500.0000 mg | ORAL_TABLET | Freq: Two times a day (BID) | ORAL | 3 refills | Status: AC
  Filled 2024-03-06: qty 180, 90d supply, fill #0
  Filled 2024-08-12: qty 180, 90d supply, fill #1

## 2024-03-28 ENCOUNTER — Other Ambulatory Visit (HOSPITAL_COMMUNITY): Payer: Self-pay

## 2024-04-08 ENCOUNTER — Other Ambulatory Visit (HOSPITAL_BASED_OUTPATIENT_CLINIC_OR_DEPARTMENT_OTHER): Payer: Self-pay

## 2024-04-08 MED ORDER — FLUZONE HIGH-DOSE 0.5 ML IM SUSY
0.5000 mL | PREFILLED_SYRINGE | Freq: Once | INTRAMUSCULAR | 0 refills | Status: AC
  Filled 2024-04-08: qty 0.5, 1d supply, fill #0

## 2024-04-08 MED ORDER — COMIRNATY 30 MCG/0.3ML IM SUSY
0.3000 mL | PREFILLED_SYRINGE | Freq: Once | INTRAMUSCULAR | 0 refills | Status: AC
  Filled 2024-04-08: qty 0.3, 1d supply, fill #0

## 2024-04-15 ENCOUNTER — Encounter: Payer: Self-pay | Admitting: Family Medicine

## 2024-04-15 ENCOUNTER — Ambulatory Visit (HOSPITAL_BASED_OUTPATIENT_CLINIC_OR_DEPARTMENT_OTHER)
Admission: RE | Admit: 2024-04-15 | Discharge: 2024-04-15 | Disposition: A | Discharge status: Home / Self Care | Source: Ambulatory Visit | Attending: Family Medicine | Admitting: Family Medicine

## 2024-04-15 ENCOUNTER — Encounter (HOSPITAL_BASED_OUTPATIENT_CLINIC_OR_DEPARTMENT_OTHER): Payer: Self-pay

## 2024-04-15 DIAGNOSIS — Z1231 Encounter for screening mammogram for malignant neoplasm of breast: Secondary | ICD-10-CM | POA: Insufficient documentation

## 2024-04-15 DIAGNOSIS — M810 Age-related osteoporosis without current pathological fracture: Secondary | ICD-10-CM

## 2024-04-15 DIAGNOSIS — M8589 Other specified disorders of bone density and structure, multiple sites: Secondary | ICD-10-CM | POA: Diagnosis not present

## 2024-04-15 DIAGNOSIS — Z780 Asymptomatic menopausal state: Secondary | ICD-10-CM | POA: Diagnosis not present

## 2024-04-16 ENCOUNTER — Other Ambulatory Visit (HOSPITAL_COMMUNITY): Payer: Self-pay

## 2024-05-26 ENCOUNTER — Other Ambulatory Visit (HOSPITAL_COMMUNITY): Payer: Self-pay

## 2024-06-16 ENCOUNTER — Other Ambulatory Visit (HOSPITAL_BASED_OUTPATIENT_CLINIC_OR_DEPARTMENT_OTHER): Payer: Self-pay

## 2024-07-29 ENCOUNTER — Other Ambulatory Visit (HOSPITAL_COMMUNITY): Payer: Self-pay

## 2024-07-29 ENCOUNTER — Telehealth: Payer: Self-pay

## 2024-07-29 NOTE — Telephone Encounter (Signed)
 Prolia  VOB initiated via [email or web address]  Next Prolia  inj DUE: 08/07/24

## 2024-08-05 ENCOUNTER — Other Ambulatory Visit (HOSPITAL_COMMUNITY): Payer: Self-pay

## 2024-08-05 NOTE — Telephone Encounter (Signed)
 SABRA

## 2024-08-05 NOTE — Telephone Encounter (Signed)
 MEDICAL PA SUBMITTED VIA LATENT. KEY: [id number]

## 2024-08-08 ENCOUNTER — Other Ambulatory Visit (HOSPITAL_COMMUNITY): Payer: Self-pay

## 2024-08-08 NOTE — Telephone Encounter (Signed)
 PHARMACY PA SUBMITTED VIA LATENT. KEY: [id number]    PA#: [id number] $149.75

## 2024-08-11 ENCOUNTER — Other Ambulatory Visit (HOSPITAL_COMMUNITY): Payer: Self-pay

## 2024-08-11 NOTE — Telephone Encounter (Signed)
 Pt ready for scheduling for PROLIA  on or after : 08/11/24  Option# 1: Buy/Bill (Office supplied medication)  Out-of-pocket cost due at time of clinic visit: $352  Number of injection/visits approved: 2  Primary: [employer] Prolia  co-insurance: 20% Admin fee co-insurance: 0%  Secondary: --- Prolia  co-insurance:  Admin fee co-insurance:   Medical Benefit Details: Date Benefits were checked: 08/05/24 Deductible: NO/ Coinsurance: 20%/ Admin Fee: 0%  Prior Auth: APPROVED PA# [id number] Expiration Date: 08/05/24-08/05/25  # of doses approved: 2 ----------------------------------------------------------------------- Option# 2- Med Obtained from pharmacy: [facility] PREFERRED FOR PHARMACY BENEFIT  Pharmacy benefit: Copay $850.24 (Paid to pharmacy) Admin Fee: 0% (Pay at clinic)  Prior Auth: APPROVED PA# [id number] Expiration Date: 08/08/24-08/08/25  # of doses approved: 2   If patient wants fill through the pharmacy benefit please send prescription to: [facility], and include estimated need by date in rx notes. Pharmacy will ship medication directly to the office.  Patient NOT eligible for Prolia  Copay Card. Copay Card can make patient's cost as little as $25. Link to apply: [email or web address]  ** This summary of benefits is an estimation of the patient's out-of-pocket cost. Exact cost may very based on individual plan coverage.

## 2024-08-19 ENCOUNTER — Ambulatory Visit

## 2024-08-19 DIAGNOSIS — M810 Age-related osteoporosis without current pathological fracture: Secondary | ICD-10-CM | POA: Diagnosis not present

## 2024-08-19 MED ORDER — DENOSUMAB 60 MG/ML ~~LOC~~ SOSY: 60.0000 mg | PREFILLED_SYRINGE | SUBCUTANEOUS | Status: AC

## 2024-08-19 NOTE — Progress Notes (Signed)
 Pt was in office today for Prolia  injection, injection was given subcutaneous in L arm. Pt tolerated well. Pt is aware she will receive a call to schedule next injection.
